# Patient Record
Sex: Female | Born: 1975
Health system: Southern US, Community
[De-identification: ages and names within clinical notes are randomized; demographics above are authoritative.]

## PROBLEM LIST (undated history)

## (undated) DIAGNOSIS — L400 Psoriasis vulgaris: Secondary | ICD-10-CM

## (undated) DIAGNOSIS — I10 Essential (primary) hypertension: Secondary | ICD-10-CM

## (undated) DIAGNOSIS — M4317 Spondylolisthesis, lumbosacral region: Secondary | ICD-10-CM

## (undated) DIAGNOSIS — K5904 Chronic idiopathic constipation: Secondary | ICD-10-CM

## (undated) DIAGNOSIS — E8941 Symptomatic postprocedural ovarian failure: Secondary | ICD-10-CM

## (undated) DIAGNOSIS — E785 Hyperlipidemia, unspecified: Secondary | ICD-10-CM

## (undated) DIAGNOSIS — G43909 Migraine, unspecified, not intractable, without status migrainosus: Secondary | ICD-10-CM

## (undated) DIAGNOSIS — J439 Emphysema, unspecified: Secondary | ICD-10-CM

## (undated) DIAGNOSIS — J3089 Other allergic rhinitis: Secondary | ICD-10-CM

## (undated) DIAGNOSIS — B019 Varicella without complication: Secondary | ICD-10-CM

## (undated) HISTORY — DX: Varicella without complication: B01.9

## (undated) HISTORY — DX: Symptomatic postprocedural ovarian failure: E89.41

## (undated) HISTORY — DX: Essential (primary) hypertension: I10

## (undated) HISTORY — DX: Other allergic rhinitis: J30.89

## (undated) HISTORY — DX: Spondylolisthesis, lumbosacral region: M43.17

## (undated) HISTORY — DX: Chronic idiopathic constipation: K59.04

## (undated) HISTORY — PX: PLANTAR'S WART EXCISION: SHX2240

## (undated) HISTORY — DX: Migraine, unspecified, not intractable, without status migrainosus: G43.909

## (undated) HISTORY — DX: Hyperlipidemia, unspecified: E78.5

## (undated) HISTORY — DX: Psoriasis vulgaris: L40.0

## (undated) HISTORY — PX: TOOTH EXTRACTION: SHX859

---

## 2002-10-11 HISTORY — PX: LAPAROSCOPY: SHX197

## 2004-02-08 HISTORY — PX: DILATION AND CURETTAGE OF UTERUS: SHX78

## 2006-05-03 HISTORY — PX: ABDOMINAL HYSTERECTOMY: SHX81

## 2012-02-08 HISTORY — PX: UMBILICAL HERNIA REPAIR: SHX196

## 2015-02-08 HISTORY — PX: ANORECTAL MANOMETRY: SHX298

## 2015-10-21 HISTORY — PX: COLONOSCOPY: SHX174

## 2016-01-13 DIAGNOSIS — I10 Essential (primary) hypertension: Secondary | ICD-10-CM | POA: Diagnosis not present

## 2016-01-13 DIAGNOSIS — Z6833 Body mass index (BMI) 33.0-33.9, adult: Secondary | ICD-10-CM | POA: Diagnosis not present

## 2016-01-14 DIAGNOSIS — M8588 Other specified disorders of bone density and structure, other site: Secondary | ICD-10-CM | POA: Diagnosis not present

## 2016-02-05 DIAGNOSIS — Z87891 Personal history of nicotine dependence: Secondary | ICD-10-CM | POA: Diagnosis not present

## 2016-02-05 DIAGNOSIS — J019 Acute sinusitis, unspecified: Secondary | ICD-10-CM | POA: Diagnosis not present

## 2016-02-05 DIAGNOSIS — B379 Candidiasis, unspecified: Secondary | ICD-10-CM | POA: Diagnosis not present

## 2016-06-10 DIAGNOSIS — R600 Localized edema: Secondary | ICD-10-CM | POA: Diagnosis not present

## 2016-06-10 DIAGNOSIS — M25532 Pain in left wrist: Secondary | ICD-10-CM | POA: Diagnosis not present

## 2016-06-10 DIAGNOSIS — J432 Centrilobular emphysema: Secondary | ICD-10-CM | POA: Diagnosis not present

## 2016-06-20 DIAGNOSIS — L4050 Arthropathic psoriasis, unspecified: Secondary | ICD-10-CM | POA: Diagnosis not present

## 2016-06-20 DIAGNOSIS — L409 Psoriasis, unspecified: Secondary | ICD-10-CM | POA: Diagnosis not present

## 2016-06-28 DIAGNOSIS — S92301D Fracture of unspecified metatarsal bone(s), right foot, subsequent encounter for fracture with routine healing: Secondary | ICD-10-CM | POA: Diagnosis not present

## 2016-06-28 DIAGNOSIS — M79671 Pain in right foot: Secondary | ICD-10-CM | POA: Diagnosis not present

## 2016-06-28 DIAGNOSIS — M84374A Stress fracture, right foot, initial encounter for fracture: Secondary | ICD-10-CM | POA: Diagnosis not present

## 2016-06-28 DIAGNOSIS — M7742 Metatarsalgia, left foot: Secondary | ICD-10-CM | POA: Diagnosis not present

## 2016-07-05 DIAGNOSIS — R600 Localized edema: Secondary | ICD-10-CM | POA: Diagnosis not present

## 2016-07-05 DIAGNOSIS — M25532 Pain in left wrist: Secondary | ICD-10-CM | POA: Diagnosis not present

## 2016-07-05 DIAGNOSIS — R0781 Pleurodynia: Secondary | ICD-10-CM | POA: Diagnosis not present

## 2016-07-05 DIAGNOSIS — J439 Emphysema, unspecified: Secondary | ICD-10-CM | POA: Diagnosis not present

## 2016-07-07 DIAGNOSIS — R600 Localized edema: Secondary | ICD-10-CM | POA: Diagnosis not present

## 2016-07-07 DIAGNOSIS — J439 Emphysema, unspecified: Secondary | ICD-10-CM | POA: Diagnosis not present

## 2016-07-07 DIAGNOSIS — F17210 Nicotine dependence, cigarettes, uncomplicated: Secondary | ICD-10-CM | POA: Diagnosis not present

## 2016-07-07 DIAGNOSIS — R0600 Dyspnea, unspecified: Secondary | ICD-10-CM | POA: Diagnosis not present

## 2016-08-04 DIAGNOSIS — J309 Allergic rhinitis, unspecified: Secondary | ICD-10-CM | POA: Diagnosis not present

## 2016-08-04 DIAGNOSIS — R05 Cough: Secondary | ICD-10-CM | POA: Diagnosis not present

## 2016-08-04 DIAGNOSIS — J439 Emphysema, unspecified: Secondary | ICD-10-CM | POA: Diagnosis not present

## 2016-08-04 DIAGNOSIS — R0781 Pleurodynia: Secondary | ICD-10-CM | POA: Diagnosis not present

## 2016-08-07 ENCOUNTER — Emergency Department: Payer: BLUE CROSS/BLUE SHIELD

## 2016-08-07 ENCOUNTER — Emergency Department
Admission: EM | Admit: 2016-08-07 | Discharge: 2016-08-07 | Disposition: A | Discharge status: Home / Self Care | Payer: BLUE CROSS/BLUE SHIELD | Attending: Emergency Medicine | Admitting: Emergency Medicine

## 2016-08-07 DIAGNOSIS — M25462 Effusion, left knee: Secondary | ICD-10-CM | POA: Diagnosis not present

## 2016-08-07 DIAGNOSIS — X58XXXA Exposure to other specified factors, initial encounter: Secondary | ICD-10-CM | POA: Diagnosis not present

## 2016-08-07 DIAGNOSIS — S93402A Sprain of unspecified ligament of left ankle, initial encounter: Secondary | ICD-10-CM

## 2016-08-07 DIAGNOSIS — Y9301 Activity, walking, marching and hiking: Secondary | ICD-10-CM | POA: Diagnosis not present

## 2016-08-07 DIAGNOSIS — S80212A Abrasion, left knee, initial encounter: Secondary | ICD-10-CM | POA: Diagnosis not present

## 2016-08-07 DIAGNOSIS — Y929 Unspecified place or not applicable: Secondary | ICD-10-CM | POA: Diagnosis not present

## 2016-08-07 DIAGNOSIS — Y999 Unspecified external cause status: Secondary | ICD-10-CM | POA: Diagnosis not present

## 2016-08-07 DIAGNOSIS — S8992XA Unspecified injury of left lower leg, initial encounter: Secondary | ICD-10-CM | POA: Diagnosis not present

## 2016-08-07 MED ORDER — BACLOFEN 10 MG PO TABS: 10.0000 mg | ORAL_TABLET | Freq: Three times a day (TID) | ORAL | 0 refills | Status: DC

## 2016-08-07 MED ORDER — BACITRACIN ZINC 500 UNIT/GM EX OINT: 1.0000 "application " | TOPICAL_OINTMENT | Freq: Two times a day (BID) | CUTANEOUS | Status: DC

## 2016-08-07 MED ORDER — TRAMADOL HCL 50 MG PO TABS: 50.0000 mg | ORAL_TABLET | Freq: Four times a day (QID) | ORAL | 0 refills | Status: DC | PRN

## 2016-08-07 MED ORDER — NAPROXEN 500 MG PO TABS: 500.0000 mg | ORAL_TABLET | Freq: Two times a day (BID) | ORAL | 0 refills | Status: DC

## 2016-08-07 NOTE — Discharge Instructions (Signed)
Please follow-up with your primary care provider or the orthopedist choice for symptoms that are not improving over the week. Return to the emergency department for symptoms that change or worsen after unable schedule an appointment.

## 2016-08-07 NOTE — ED Triage Notes (Signed)
Patient fell onto her left knee. C/o left knee pain

## 2016-08-07 NOTE — ED Provider Notes (Signed)
[facility] Emergency Department Provider Note ____________________________________________  Time seen: Approximately 2:53 PM  I have reviewed the triage vital signs and the nursing notes.   HISTORY  Chief Complaint Knee Pain    HPI Dawn Hays is a 41 y.o. female who presents to the emergency department for evaluation of left knee pain. While walking today, she states that she feels like her left knee just sort of "gave out." She fell directly onto the ground and landed on the knee. She now has pain just above the left knee, the knee itself, and the entire lower left leg including the ankle. Wound was cleaned after the fall. She has not taken any medications for her pain since the incident.  History reviewed. No pertinent past medical history.  There are no active problems to display for this patient.   No past surgical history on file.  Prior to Admission medications   Medication Sig Start Date End Date Taking? Authorizing Provider  baclofen (LIORESAL) 10 MG tablet Take 1 tablet (10 mg total) by mouth 3 (three) times daily. 08/07/16   Meaghann Choo B, FNP  naproxen (NAPROSYN) 500 MG tablet Take 1 tablet (500 mg total) by mouth 2 (two) times daily with a meal. 08/07/16   Monique Hefty B, FNP  traMADol (ULTRAM) 50 MG tablet Take 1 tablet (50 mg total) by mouth every 6 (six) hours as needed. 08/07/16   Chinita Pesterriplett, Chantalle Defilippo B, FNP    Allergies Patient has no allergy information on record.  History reviewed. No pertinent family history.  Social History Social History  Substance Use Topics  . Smoking status: Not on file  . Smokeless tobacco: Not on file  . Alcohol use Not on file    Review of Systems Constitutional: Negative for recent illness. Cardiovascular: Negative for chest pain. Respiratory: Negative for shortness of breath. Musculoskeletal: Positive for left lower extremity pain. Skin: Positive for abrasion to left knee.  Neurological: Negative for  decrease in sensation, paresthesias, ordered a cooperative.  ____________________________________________   PHYSICAL EXAM:  VITAL SIGNS: ED Triage Vitals  Enc Vitals Group     BP --      Pulse --      Resp --      Temp --      Temp src --      SpO2 --      Weight 08/07/16 1437 210 lb (95.3 kg)     Height 08/07/16 1437 5\' 5"  (1.651 m)     Head Circumference --      Peak Flow --      Pain Score 08/07/16 1434 8     Pain Loc --      Pain Edu? --      Excl. in GC? --     Constitutional: Alert and oriented. Well appearing and in no acute distress. Eyes: Conjunctivae are clear without discharge or drainage.  Head: Atraumatic. Neck: Nexus criteria is negative. Respiratory: Respirations are even and unlabored. Musculoskeletal: Patient unwilling to attempt to bend the knee, however she was observed changing positions in bed and was able to flex and extend the knee. Otherwise, there is full, active range of motion throughout. Neurologic: Sharp and dull discrimination is intact over the left lower extremity. Patient is awake and alert, oriented 4.  Skin: Abrasion noted to the left knee.  Psychiatric: Behavior and affect is appropriate.  ____________________________________________   LABS (all labs ordered are listed, but only abnormal results are displayed)  Labs Reviewed -  No data to display ____________________________________________  RADIOLOGY  Left knee him and shows small joint effusion, otherwise no bony abnormality. Left tibia and fibula image does not show any acute bony abnormality with exception of the leg and demonstrated joint effusion of the left knee. ____________________________________________   PROCEDURES  Procedure(s) performed: Knee immobilizer applied by ER tech. Patient neurovascularly intact post-application.  ____________________________________________   INITIAL IMPRESSION / ASSESSMENT AND PLAN / ED COURSE  Dawn Hays is a 41 y.o. female who  presents to the emergency department for evaluation and treatment of left lower extremity pain after a mechanical, non-syncopal fall. She has no acute bony abnormality demonstrated on imaging. She was applied and a knee immobilizer and given wound care instructions. She was instructed to follow-up with her primary care provider or orthopedics for symptoms that are not improving over the week. She was encouraged to return to the emergency department for symptoms that change or worsen if she is unable schedule an appointment.  Pertinent labs & imaging results that were available during my care of the patient were reviewed by me and considered in my medical decision making (see chart for details).  _________________________________________   FINAL CLINICAL IMPRESSION(S) / ED DIAGNOSES  Final diagnoses:  Effusion of left knee  Abrasion, knee, left, initial encounter  Sprain of left ankle, unspecified ligament, initial encounter    Discharge Medication List as of 08/07/2016  4:30 PM    START taking these medications   Details  baclofen (LIORESAL) 10 MG tablet Take 1 tablet (10 mg total) by mouth 3 (three) times daily., Starting Sun 08/07/2016, Print    naproxen (NAPROSYN) 500 MG tablet Take 1 tablet (500 mg total) by mouth 2 (two) times daily with a meal., Starting Sun 08/07/2016, Print    traMADol (ULTRAM) 50 MG tablet Take 1 tablet (50 mg total) by mouth every 6 (six) hours as needed., Starting Sun 08/07/2016, Print        If controlled substance prescribed during this visit, 12 month history viewed on the [facility] prior to issuing an initial prescription for Schedule II or III opiod.    Chinita Pester, FNP 08/07/16 1658    Sharyn Creamer, MD 08/09/16 [phone]

## 2016-08-11 ENCOUNTER — Emergency Department
Admission: EM | Admit: 2016-08-11 | Discharge: 2016-08-12 | Disposition: A | Discharge status: Home / Self Care | Payer: BLUE CROSS/BLUE SHIELD | Attending: Emergency Medicine | Admitting: Emergency Medicine

## 2016-08-11 ENCOUNTER — Emergency Department: Payer: BLUE CROSS/BLUE SHIELD

## 2016-08-11 DIAGNOSIS — T404X5A Adverse effect of other synthetic narcotics, initial encounter: Secondary | ICD-10-CM | POA: Diagnosis not present

## 2016-08-11 DIAGNOSIS — T50905A Adverse effect of unspecified drugs, medicaments and biological substances, initial encounter: Secondary | ICD-10-CM

## 2016-08-11 DIAGNOSIS — Y69 Unspecified misadventure during surgical and medical care: Secondary | ICD-10-CM | POA: Diagnosis not present

## 2016-08-11 DIAGNOSIS — T428X5A Adverse effect of antiparkinsonism drugs and other central muscle-tone depressants, initial encounter: Secondary | ICD-10-CM | POA: Diagnosis not present

## 2016-08-11 DIAGNOSIS — T887XXA Unspecified adverse effect of drug or medicament, initial encounter: Secondary | ICD-10-CM | POA: Diagnosis not present

## 2016-08-11 DIAGNOSIS — F17200 Nicotine dependence, unspecified, uncomplicated: Secondary | ICD-10-CM | POA: Diagnosis not present

## 2016-08-11 DIAGNOSIS — T39315A Adverse effect of propionic acid derivatives, initial encounter: Secondary | ICD-10-CM | POA: Diagnosis not present

## 2016-08-11 DIAGNOSIS — R4182 Altered mental status, unspecified: Secondary | ICD-10-CM | POA: Diagnosis not present

## 2016-08-11 HISTORY — DX: Emphysema, unspecified: J43.9

## 2016-08-11 LAB — COMPREHENSIVE METABOLIC PANEL
ALBUMIN: 3.5 g/dL (ref 3.5–5.0)
ALT: 14 U/L (ref 14–54)
AST: 24 U/L (ref 15–41)
Alkaline Phosphatase: 105 U/L (ref 38–126)
Anion gap: 10 (ref 5–15)
BUN: 16 mg/dL (ref 6–20)
CHLORIDE: 95 mmol/L — AB (ref 101–111)
CO2: 30 mmol/L (ref 22–32)
Calcium: 9.7 mg/dL (ref 8.9–10.3)
Creatinine, Ser: 1.01 mg/dL — ABNORMAL HIGH (ref 0.44–1.00)
GFR calc Af Amer: >60 mL/min (ref >60)
Glucose, Bld: 113 mg/dL — ABNORMAL HIGH (ref 65–99)
POTASSIUM: 3 mmol/L — AB (ref 3.5–5.1)
SODIUM: 135 mmol/L (ref 135–145)
Total Bilirubin: 0.5 mg/dL (ref 0.3–1.2)
Total Protein: 7.2 g/dL (ref 6.5–8.1)

## 2016-08-11 LAB — URINALYSIS, COMPLETE (UACMP) WITH MICROSCOPIC
Bilirubin Urine: NEGATIVE
Glucose, UA: NEGATIVE mg/dL
Hgb urine dipstick: NEGATIVE
KETONES UR: NEGATIVE mg/dL
LEUKOCYTES UA: NEGATIVE
Nitrite: NEGATIVE
PROTEIN: NEGATIVE mg/dL
Specific Gravity, Urine: 1.01 (ref 1.005–1.030)
WBC, UA: NONE SEEN WBC/hpf (ref 0–5)
pH: 5 (ref 5.0–8.0)

## 2016-08-11 LAB — URINE DRUG SCREEN, QUALITATIVE (ARMC ONLY)
Amphetamines, Ur Screen: NOT DETECTED
BENZODIAZEPINE, UR SCRN: NOT DETECTED
Barbiturates, Ur Screen: NOT DETECTED
CANNABINOID 50 NG, UR ~~LOC~~: NOT DETECTED
Cocaine Metabolite,Ur ~~LOC~~: NOT DETECTED
MDMA (Ecstasy)Ur Screen: NOT DETECTED
Methadone Scn, Ur: NOT DETECTED
Opiate, Ur Screen: NOT DETECTED
PHENCYCLIDINE (PCP) UR S: NOT DETECTED
Tricyclic, Ur Screen: NOT DETECTED

## 2016-08-11 LAB — POCT PREGNANCY, URINE: PREG TEST UR: NEGATIVE

## 2016-08-11 LAB — CBC
HEMATOCRIT: 31.9 % — AB (ref 35.0–47.0)
Hemoglobin: 11.2 g/dL — ABNORMAL LOW (ref 12.0–16.0)
MCH: 30.2 pg (ref 26.0–34.0)
MCHC: 35 g/dL (ref 32.0–36.0)
MCV: 86.4 fL (ref 80.0–100.0)
Platelets: 306 10*3/uL (ref 150–440)
RBC: 3.69 MIL/uL — AB (ref 3.80–5.20)
RDW: 13 % (ref 11.5–14.5)
WBC: 9.8 10*3/uL (ref 3.6–11.0)

## 2016-08-11 LAB — GLUCOSE, CAPILLARY
GLUCOSE-CAPILLARY: 117 mg/dL — AB (ref 65–99)
GLUCOSE-CAPILLARY: 119 mg/dL — AB (ref 65–99)

## 2016-08-11 MED ORDER — NALOXONE HCL 2 MG/2ML IJ SOSY
1.0000 mg | PREFILLED_SYRINGE | Freq: Once | INTRAMUSCULAR | Status: AC
  Administered 2016-08-11: 1 mg via INTRAVENOUS
  Filled 2016-08-11: qty 2

## 2016-08-11 NOTE — ED Notes (Signed)
Blood sugar 119

## 2016-08-11 NOTE — ED Notes (Signed)
Blood sugar 117

## 2016-08-11 NOTE — ED Provider Notes (Signed)
[facility] Emergency Department Provider Note       Time seen: ----------------------------------------- 8:21 PM on 08/11/2016 -----------------------------------------    I have reviewed the triage vital signs and the nursing notes.   HISTORY   Chief Complaint Altered Mental Status    HPI Dawn Hays is a 41 y.o. female who presents to the ED for complaints of lethargy and altered mental status. Patient was recently placed on baclofen, tramadol and naproxen for pain. According to the husband she has been taking the medications as prescribed. Patient also is having tremors in the room, patient states her arms and legs keep wanting to move on their own.   Past Medical History:  Diagnosis Date  . Psoriasis   . Pulmonary emphysema (HCC)     There are no active problems to display for this patient.   History reviewed. No pertinent surgical history.  Allergies Latex; Prednisone; Promethazine; Proxine hemorrhoidal [pramoxine]; and Sumatriptan  Social History Social History  Substance Use Topics  . Smoking status: Current Every Day Smoker  . Smokeless tobacco: Not on file  . Alcohol use No    Review of Systems Constitutional: Negative for fever. Eyes: Negative for vision changes ENT:  Negative for congestion, sore throat Cardiovascular: Negative for chest pain. Respiratory: Negative for shortness of breath. Gastrointestinal: Negative for abdominal pain, vomiting and diarrhea. Genitourinary: Negative for dysuria. Musculoskeletal: Positive left knee pain Skin: Positive for chronic skin disorder Neurological: Positive for altered mental status, tremors  All systems negative/normal/unremarkable except as stated in the HPI  ____________________________________________   PHYSICAL EXAM:  VITAL SIGNS: ED Triage Vitals  Enc Vitals Group     BP 08/11/16 1950 126/70     Pulse Rate 08/11/16 1950 79     Resp 08/11/16 1950 12     Temp  08/11/16 1950 98.5 F (36.9 C)     Temp Source 08/11/16 1950 Oral     SpO2 08/11/16 1950 96 %     Weight 08/11/16 1956 210 lb (95.3 kg)     Height 08/11/16 1956 5\' 5"  (1.651 m)     Head Circumference --      Peak Flow --      Pain Score --      Pain Loc --      Pain Edu? --      Excl. in GC? --    Constitutional: Alert and oriented. Well appearing and in no distress. Eyes: Conjunctivae are normal. Normal extraocular movements. ENT   Head: Normocephalic and atraumatic.   Nose: No congestion/rhinnorhea.   Mouth/Throat: Mucous membranes are moist.   Neck: No stridor. Cardiovascular: Normal rate, regular rhythm. No murmurs, rubs, or gallops. Respiratory: Normal respiratory effort without tachypnea nor retractions. Breath sounds are clear and equal bilaterally. No wheezes/rales/rhonchi. Gastrointestinal: Soft and nontender. Normal bowel sounds Musculoskeletal: Nontender with normal range of motion in extremities. No lower extremity tenderness nor edema. Neurologic:  Normal speech and language. No gross focal neurologic deficits are appreciated. Voluntary tremor is noted Skin:  Skin is warm, dry and intact. No rash noted. Psychiatric: Bizarre mood and affect at times ____________________________________________  ED COURSE:  Pertinent labs & imaging results that were available during my care of the patient were reviewed by me and considered in my medical decision making (see chart for details). Patient presents for altered mental status, we will assess with labs and imaging as indicated.   Procedures ____________________________________________   LABS (pertinent positives/negatives)  Labs Reviewed  COMPREHENSIVE METABOLIC PANEL -  Abnormal; Notable for the following:       Result Value   Potassium 3.0 (*)    Chloride 95 (*)    Glucose, Bld 113 (*)    Creatinine, Ser 1.01 (*)    All other components within normal limits  CBC - Abnormal; Notable for the following:     RBC 3.69 (*)    Hemoglobin 11.2 (*)    HCT 31.9 (*)    All other components within normal limits  URINALYSIS, COMPLETE (UACMP) WITH MICROSCOPIC - Abnormal; Notable for the following:    Color, Urine YELLOW (*)    APPearance CLEAR (*)    Bacteria, UA RARE (*)    Squamous Epithelial / LPF 0-5 (*)    All other components within normal limits  GLUCOSE, CAPILLARY - Abnormal; Notable for the following:    Glucose-Capillary 117 (*)    All other components within normal limits  GLUCOSE, CAPILLARY - Abnormal; Notable for the following:    Glucose-Capillary 119 (*)    All other components within normal limits  URINE DRUG SCREEN, QUALITATIVE ([facility] ONLY)  CBG MONITORING, ED  CBG MONITORING, ED  POCT PREGNANCY, URINE  POC URINE PREG, ED    RADIOLOGY Images were viewed by me  CT head IMPRESSION: Unremarkable noncontrast head CT. ____________________________________________  FINAL ASSESSMENT AND PLAN  Adverse medication reaction  Plan: Patient's labs and imaging were dictated above. Patient had presented for Altered mental status which appear to be secondary to medications that she is taking. I have instructed the husband to stop baclofen, tramadol and Soma. Otherwise her workup and testing here is unremarkable. She is stable for outpatient follow-up.   Emily FilbertWilliams, Jonathan E, MD   Note: This note was generated in part or whole with voice recognition software. Voice recognition is usually quite accurate but there are transcription errors that can and very often do occur. I apologize for any typographical errors that were not detected and corrected.     Emily FilbertWilliams, Jonathan E, MD 08/11/16 2252

## 2016-08-11 NOTE — ED Triage Notes (Signed)
Pt came in with husband with complaints of increased lethargy and AMS.  Pt was recently put on baclofen, tramadol and naproxen for pain on Sunday by this ER.  No medications missing and per husband he has been giving patient's her doses.  Pt unable to keep head up in triage.  When asked questions, pt repeats question back until she answers.  Pt has delayed responses and memory impairment.  Husband and daughter state that the last known well was Monday or Tuesday.  Pt having involuntary tremors as well at this time.  Report given to [place], RN and pt placed in room after CT.

## 2016-08-20 LAB — HM COLONOSCOPY

## 2016-10-17 DIAGNOSIS — E7800 Pure hypercholesterolemia, unspecified: Secondary | ICD-10-CM | POA: Diagnosis not present

## 2016-10-17 DIAGNOSIS — L409 Psoriasis, unspecified: Secondary | ICD-10-CM | POA: Diagnosis not present

## 2016-10-17 DIAGNOSIS — N951 Menopausal and female climacteric states: Secondary | ICD-10-CM | POA: Diagnosis not present

## 2016-10-17 DIAGNOSIS — R51 Headache: Secondary | ICD-10-CM | POA: Diagnosis not present

## 2016-10-17 DIAGNOSIS — Z79899 Other long term (current) drug therapy: Secondary | ICD-10-CM | POA: Diagnosis not present

## 2016-10-17 DIAGNOSIS — Z5181 Encounter for therapeutic drug level monitoring: Secondary | ICD-10-CM | POA: Diagnosis not present

## 2016-10-17 DIAGNOSIS — M4316 Spondylolisthesis, lumbar region: Secondary | ICD-10-CM | POA: Diagnosis not present

## 2016-10-17 DIAGNOSIS — F17210 Nicotine dependence, cigarettes, uncomplicated: Secondary | ICD-10-CM | POA: Diagnosis not present

## 2016-10-17 DIAGNOSIS — M533 Sacrococcygeal disorders, not elsewhere classified: Secondary | ICD-10-CM | POA: Diagnosis not present

## 2016-10-17 DIAGNOSIS — G894 Chronic pain syndrome: Secondary | ICD-10-CM | POA: Diagnosis not present

## 2016-10-20 DIAGNOSIS — D126 Benign neoplasm of colon, unspecified: Secondary | ICD-10-CM | POA: Diagnosis not present

## 2016-10-21 DIAGNOSIS — D120 Benign neoplasm of cecum: Secondary | ICD-10-CM | POA: Diagnosis not present

## 2016-10-21 DIAGNOSIS — D122 Benign neoplasm of ascending colon: Secondary | ICD-10-CM | POA: Diagnosis not present

## 2016-10-21 DIAGNOSIS — D124 Benign neoplasm of descending colon: Secondary | ICD-10-CM | POA: Diagnosis not present

## 2016-10-21 DIAGNOSIS — K635 Polyp of colon: Secondary | ICD-10-CM | POA: Diagnosis not present

## 2016-10-21 DIAGNOSIS — Z86010 Personal history of colonic polyps: Secondary | ICD-10-CM | POA: Diagnosis not present

## 2016-10-21 HISTORY — PX: COLONOSCOPY: SHX174

## 2016-12-19 DIAGNOSIS — L409 Psoriasis, unspecified: Secondary | ICD-10-CM | POA: Diagnosis not present

## 2016-12-19 DIAGNOSIS — Z5181 Encounter for therapeutic drug level monitoring: Secondary | ICD-10-CM | POA: Diagnosis not present

## 2016-12-23 DIAGNOSIS — I998 Other disorder of circulatory system: Secondary | ICD-10-CM | POA: Diagnosis not present

## 2016-12-23 DIAGNOSIS — R232 Flushing: Secondary | ICD-10-CM | POA: Diagnosis not present

## 2016-12-23 DIAGNOSIS — Z23 Encounter for immunization: Secondary | ICD-10-CM | POA: Diagnosis not present

## 2016-12-23 DIAGNOSIS — M4316 Spondylolisthesis, lumbar region: Secondary | ICD-10-CM | POA: Diagnosis not present

## 2016-12-23 DIAGNOSIS — Z720 Tobacco use: Secondary | ICD-10-CM | POA: Diagnosis not present

## 2017-01-24 ENCOUNTER — Ambulatory Visit: Payer: BLUE CROSS/BLUE SHIELD | Admitting: Primary Care

## 2017-02-03 DIAGNOSIS — R0781 Pleurodynia: Secondary | ICD-10-CM | POA: Diagnosis not present

## 2017-02-03 DIAGNOSIS — N83209 Unspecified ovarian cyst, unspecified side: Secondary | ICD-10-CM | POA: Diagnosis not present

## 2017-02-03 DIAGNOSIS — R232 Flushing: Secondary | ICD-10-CM | POA: Diagnosis not present

## 2017-02-03 DIAGNOSIS — R0789 Other chest pain: Secondary | ICD-10-CM | POA: Diagnosis not present

## 2017-02-03 DIAGNOSIS — J432 Centrilobular emphysema: Secondary | ICD-10-CM | POA: Diagnosis not present

## 2017-02-03 DIAGNOSIS — F17200 Nicotine dependence, unspecified, uncomplicated: Secondary | ICD-10-CM | POA: Diagnosis not present

## 2017-02-03 DIAGNOSIS — G894 Chronic pain syndrome: Secondary | ICD-10-CM | POA: Diagnosis not present

## 2017-02-03 DIAGNOSIS — R700 Elevated erythrocyte sedimentation rate: Secondary | ICD-10-CM | POA: Diagnosis not present

## 2017-02-03 DIAGNOSIS — M4316 Spondylolisthesis, lumbar region: Secondary | ICD-10-CM | POA: Diagnosis not present

## 2017-02-03 DIAGNOSIS — F17210 Nicotine dependence, cigarettes, uncomplicated: Secondary | ICD-10-CM | POA: Diagnosis not present

## 2017-02-13 ENCOUNTER — Ambulatory Visit (INDEPENDENT_AMBULATORY_CARE_PROVIDER_SITE_OTHER): Payer: BLUE CROSS/BLUE SHIELD | Admitting: Primary Care

## 2017-02-13 ENCOUNTER — Encounter: Payer: Self-pay | Admitting: Primary Care

## 2017-02-13 VITALS — BP 122/76 | HR 79 | Temp 97.9°F | Ht 63.25 in | Wt 166.8 lb

## 2017-02-13 DIAGNOSIS — J3089 Other allergic rhinitis: Secondary | ICD-10-CM | POA: Diagnosis not present

## 2017-02-13 DIAGNOSIS — E8941 Symptomatic postprocedural ovarian failure: Secondary | ICD-10-CM | POA: Diagnosis not present

## 2017-02-13 DIAGNOSIS — R600 Localized edema: Secondary | ICD-10-CM | POA: Diagnosis not present

## 2017-02-13 DIAGNOSIS — E785 Hyperlipidemia, unspecified: Secondary | ICD-10-CM | POA: Diagnosis not present

## 2017-02-13 DIAGNOSIS — M4317 Spondylolisthesis, lumbosacral region: Secondary | ICD-10-CM | POA: Diagnosis not present

## 2017-02-13 DIAGNOSIS — K5904 Chronic idiopathic constipation: Secondary | ICD-10-CM | POA: Diagnosis not present

## 2017-02-13 DIAGNOSIS — L400 Psoriasis vulgaris: Secondary | ICD-10-CM | POA: Diagnosis not present

## 2017-02-13 DIAGNOSIS — I10 Essential (primary) hypertension: Secondary | ICD-10-CM

## 2017-02-13 MED ORDER — HYDROCHLOROTHIAZIDE 12.5 MG PO CAPS: 12.5000 mg | ORAL_CAPSULE | Freq: Every day | ORAL | 0 refills | Status: DC

## 2017-02-13 MED ORDER — ATORVASTATIN CALCIUM 40 MG PO TABS: ORAL_TABLET | ORAL | 1 refills | Status: DC

## 2017-02-13 NOTE — Progress Notes (Signed)
Subjective:    Patient ID: Dawn Hays, female    DOB: [birth year], 42 y.o.   MRN: [id number]  HPI  Dawn Hays is a 42 year old female who presents today to establish care and discuss the problems mentioned below. Will obtain old records.  1) Hyperlipidemia: Previously managed on atorvastatin 40 mg once weekly for which she's been taking for the past one year. This was due to significant drops in her cholesterol levels, not due to myalgias. She does have a history of lower back pain. Her last lipid panel was normal within the last several months.   2)  Plaque Psoriasis: Currently managed on Clobetasol 0.05% ointment and solution, Cosentyx. Currently following with Dermatologist through [facility]. She may be seeing a rheumatologist soon.   3) Chronic Back Pain/Spondylolisthesis: Grade IV on L5-S1 with severe foraminal and central stenosis. Currently managed on Robaxin 3-4 times daily, Cymbalta daily, Celebrex twice daily, gabapentin three times daily. She was previously managed by pain management and spine-orthopedics. She was released from both pain management and orthopedics as she was doing well on her medications. Her previous PCP refilled these medications.   4) Essential Hypertension: Currently managed on chlorthalidone 25 mg. She stopped taking this medication several months ago due to episodes of hypotension. She's checking her BP at home and gets readings of 90's/60's and then 160's/100's with most readings running 120-150/60-100's. She does struggle with lower extremity swelling which has returned since coming off of chlorthalidone.  She was placed on Lasix for one week with significant reduction in edema. She is on her feet for 12 hours daily, most everyday. She wears compression socks and hose without improvement.   BP Readings from Last 3 Encounters:  02/13/17 122/76  08/11/16 (!) 147/74  08/07/16 104/62     5) Allergic Rhinitis: Currently managed on Zyrtec-D, Singulair, and  Ventolin HFA. No recent use of albuterol inhaler. She is following with pulmonology for a bulla that was noted on xray. She is due for another appointment next month.   6) Vasomotor Post Menopause Symptoms: Currently managed on estradiol 0.025 mg/24hour patch. Hysterectomy in 2008. She does continue to get hot flashes, does have headaches.   7) IBS/Constipation: Currently managed on Trulance 3 mg. Chronic constipation since hernia surgery. Following with GI through [facility].   Review of Systems  Constitutional: Negative for unexpected weight change.  Respiratory: Negative for shortness of breath.   Cardiovascular: Negative for chest pain.  Gastrointestinal:       Chronic constipation   Genitourinary:       Mild hot flashes on estradiol patch  Musculoskeletal:       Chronic back pain  Skin:       Psoriasis  Allergic/Immunologic: Positive for environmental allergies and food allergies.  Neurological:       Intermittent headaches  Hematological: Negative for adenopathy.       Past Medical History:  Diagnosis Date  . Chickenpox   . Hyperlipidemia   . Hypertension   . Migraines   . Psoriasis   . Pulmonary emphysema (HCC)      Social History   Socioeconomic History  . Marital status: Married    Spouse name: Not on file  . Number of children: Not on file  . Years of education: Not on file  . Highest education level: Not on file  Social Needs  . Financial resource strain: Not on file  . Food insecurity - worry: Not on file  . Food insecurity -  inability: Not on file  . Transportation needs - medical: Not on file  . Transportation needs - non-medical: Not on file  Occupational History  . Not on file  Tobacco Use  . Smoking status: Current Every Day Smoker  . Smokeless tobacco: Never Used  Substance and Sexual Activity  . Alcohol use: No  . Drug use: No  . Sexual activity: Not on file  Other Topics Concern  . Not on file  Social History Narrative  . Not on file      Past Surgical History:  Procedure Laterality Date  . ABDOMINAL HYSTERECTOMY  05/03/2006  . CESAREAN SECTION  12/13/1997  . CESAREAN SECTION  08/31/2004  . COLONOSCOPY  10/21/2015  . COLONOSCOPY  10/21/2016  . LAPAROSCOPY  10/11/2002  . TOOTH EXTRACTION  03/200    Family History  Problem Relation Age of Onset  . COPD Mother   . Arthritis Maternal Grandmother   . Hypertension Maternal Grandmother   . Kidney disease Maternal Grandmother     Allergies  Allergen Reactions  . Sumatriptan Shortness Of Breath and Swelling  . Benzocaine-Resorcinol Swelling  . Latex Swelling  . Molds & Smuts     Other reaction(s): Other (See Comments) Other reaction(s): Sneezing  . Other     Other reaction(s): Other (See Comments) Household dust, dust mites, mold   . Pramoxine Swelling  . Prednisone Swelling    Other reaction(s): Cough (ALLERGY/intolerance), Coughing, Respiratory Depression Other reaction(s): Respiratory Depression Allergic to pills, she can tolerate injections Allergic to pills, she can tolerate injections   . Promethazine     Other reaction(s): Cough (ALLERGY/intolerance), Coughing Other reaction(s): Coughing   . Sumatriptan Succinate     Other reaction(s): Cough (ALLERGY/intolerance), Respiratory Depression    Current Outpatient Medications on File Prior to Visit  Medication Sig Dispense Refill  . Albuterol Sulfate (VENTOLIN HFA IN) Inhale 2 puffs into the lungs every 4 (four) hours as needed.    . Calcium Citrate (CITRACAL PO) Take 2 capsules by mouth 2 (two) times daily.    Marland Kitchen CALCIUM PO Take 1,000 mg by mouth daily.    . celecoxib (CELEBREX) 200 MG capsule Take 200 mg by mouth 2 (two) times daily.    . Cetirizine-Pseudoephedrine (ZYRTEC-D ALLERGY & CONGESTION PO) Take 1 tablet by mouth daily as needed.    . chlorthalidone (HYGROTON) 25 MG tablet Take 25 mg by mouth daily.    . Cholecalciferol (VITAMIN D PO) Take 5,000 Units by mouth daily.    . clobetasol  (TEMOVATE) 0.05 % external solution Apply 1 application topically 2 (two) times daily.    . clobetasol ointment (TEMOVATE) 0.05 % Apply 1 application topically 2 (two) times daily.    . DULoxetine (CYMBALTA) 60 MG capsule Take 60 mg by mouth daily.    Marland Kitchen estradiol (CLIMARA - DOSED IN MG/24 HR) 0.025 mg/24hr patch Place 0.025 mg onto the skin once a week.    . gabapentin (NEURONTIN) 300 MG capsule Taking 900 mg in the morning, 900 mg in the afternoon, and 900 mg at bedtime    . methocarbamol (ROBAXIN) 500 MG tablet Take 500 mg by mouth 4 (four) times daily.    . montelukast (SINGULAIR) 10 MG tablet Take 10 mg by mouth daily as needed.    Marland Kitchen Plecanatide (TRULANCE PO) Take 3 mg by mouth daily.    . Secukinumab (COSENTYX SENSOREADY PEN) 150 MG/ML SOAJ Inject 300 mg into skin every 4 weeks    . EPINEPHrine 0.3 mg/0.3  mL IJ SOAJ injection Inject 0.3 mg into the muscle once.    . fluconazole (DIFLUCAN) 150 MG tablet Take 1 tablet by mouth for 2 days then stop     No current facility-administered medications on file prior to visit.     BP 122/76   Pulse 79   Temp 97.9 F (36.6 C) (Oral)   Ht 5' 3.25" (1.607 m)   Wt 166 lb 12.8 oz (75.7 kg)   SpO2 97%   BMI 29.31 kg/m    Objective:   Physical Exam  Constitutional: She is oriented to person, place, and time. She appears well-nourished.  Neck: Neck supple.  Cardiovascular: Normal rate and regular rhythm.  Pulmonary/Chest: Effort normal and breath sounds normal.  Musculoskeletal:  Ambulates well in clinic, no gross decrease in ROM to lower spine  Neurological: She is alert and oriented to person, place, and time.  Skin: Skin is warm and dry.  Psychiatric: She has a normal mood and affect.          Assessment & Plan:

## 2017-02-13 NOTE — Assessment & Plan Note (Signed)
Following with pulmology (Dr. Larence PenningBullard[facility]. Exam unremarkable today.

## 2017-02-13 NOTE — Assessment & Plan Note (Signed)
Taking atorvastatin 40 mg once weekly? Will obtain records for reasoning behind this. Consider lowering dose. Will obtain lipid panel from records, repeat at up coming CPE.

## 2017-02-13 NOTE — Assessment & Plan Note (Signed)
Previously following with orthopedics and pain management. Will continue medications of Celebrex, gabapentin, Robaxin, and Cymbalta given stability. Will obtain records for imaging and notes.

## 2017-02-13 NOTE — Assessment & Plan Note (Addendum)
[facility] (Dr. Lanell MatarMishra), continue Trulance.

## 2017-02-13 NOTE — Assessment & Plan Note (Signed)
Discussed risk of long term use of hormones given hysterectomy, she verbalized understanding. She is on a low does and is changing her patch every 7 days. Advised she stop smoking given increased risks to her health.

## 2017-02-13 NOTE — Assessment & Plan Note (Addendum)
Stable in the office today off of medication, labile readings at home. Will trial a low dose HCTZ at 12.5 mg once daily. She will monitor blood pressure and update with any low readings. Will call for readings in 2 weeks, will obtain records for labs. Will readdress at upcoming CPE.

## 2017-02-13 NOTE — Patient Instructions (Signed)
I sent refills for your atorvastatin to the pharmacy.  Start hydrochlorothiazide 12.5 mg capsules once daily for high blood pressure and lower leg swelling.   Please schedule a physical with me in 2019. You may also schedule a lab only appointment 3-4 days prior. We will discuss your lab results in detail during your physical.  It was a pleasure to meet you today! Please don't hesitate to call or message me with any questions. Welcome to [facility]!

## 2017-02-13 NOTE — Assessment & Plan Note (Signed)
[facility] (Dr. Gibson RampFeldman). Continue current regimen.

## 2017-02-13 NOTE — Assessment & Plan Note (Signed)
Suspect due to long hours standing. Will trial low dose HCTZ 12.5 mg for edema. Continue compression socks, elevation. Will readdress at upcoming CPE.

## 2017-02-20 DIAGNOSIS — L409 Psoriasis, unspecified: Secondary | ICD-10-CM | POA: Diagnosis not present

## 2017-02-20 DIAGNOSIS — M79641 Pain in right hand: Secondary | ICD-10-CM | POA: Diagnosis not present

## 2017-02-20 DIAGNOSIS — M779 Enthesopathy, unspecified: Secondary | ICD-10-CM | POA: Diagnosis not present

## 2017-02-20 DIAGNOSIS — J439 Emphysema, unspecified: Secondary | ICD-10-CM | POA: Diagnosis not present

## 2017-02-20 DIAGNOSIS — M75102 Unspecified rotator cuff tear or rupture of left shoulder, not specified as traumatic: Secondary | ICD-10-CM | POA: Diagnosis not present

## 2017-02-20 DIAGNOSIS — M2550 Pain in unspecified joint: Secondary | ICD-10-CM | POA: Diagnosis not present

## 2017-02-20 DIAGNOSIS — M25511 Pain in right shoulder: Secondary | ICD-10-CM | POA: Diagnosis not present

## 2017-02-20 DIAGNOSIS — M79642 Pain in left hand: Secondary | ICD-10-CM | POA: Diagnosis not present

## 2017-02-20 DIAGNOSIS — M25512 Pain in left shoulder: Secondary | ICD-10-CM | POA: Diagnosis not present

## 2017-02-20 DIAGNOSIS — R0781 Pleurodynia: Secondary | ICD-10-CM | POA: Diagnosis not present

## 2017-02-20 DIAGNOSIS — M5440 Lumbago with sciatica, unspecified side: Secondary | ICD-10-CM | POA: Diagnosis not present

## 2017-02-20 DIAGNOSIS — M4316 Spondylolisthesis, lumbar region: Secondary | ICD-10-CM | POA: Diagnosis not present

## 2017-02-27 ENCOUNTER — Telehealth: Payer: Self-pay | Admitting: Primary Care

## 2017-02-27 NOTE — Telephone Encounter (Signed)
How are her BP readings and leg swelling since we started her on HCTZ 12.5 mg?

## 2017-03-01 NOTE — Telephone Encounter (Signed)
Please get patient scheduled for an office visit follow-up for blood pressure check and repeat BMP since switching medications from chlorthalidone to HCTZ.

## 2017-03-01 NOTE — Telephone Encounter (Signed)
In patient's exact words, it is alright and still have some leg swelling. She did not give specific number and I did asked.

## 2017-03-01 NOTE — Telephone Encounter (Signed)
Left message on voicemail for patient to call back. 

## 2017-03-06 NOTE — Telephone Encounter (Signed)
Spoken and notified patient of Kate's comments. Patient will back to schedule the appointment.  Will send reminder letter.

## 2017-03-08 ENCOUNTER — Encounter: Payer: Self-pay | Admitting: *Deleted

## 2017-04-22 ENCOUNTER — Other Ambulatory Visit: Payer: Self-pay | Admitting: Primary Care

## 2017-04-22 DIAGNOSIS — R600 Localized edema: Secondary | ICD-10-CM

## 2017-04-22 DIAGNOSIS — I10 Essential (primary) hypertension: Secondary | ICD-10-CM

## 2017-04-24 DIAGNOSIS — R232 Flushing: Secondary | ICD-10-CM | POA: Diagnosis not present

## 2017-04-24 DIAGNOSIS — I998 Other disorder of circulatory system: Secondary | ICD-10-CM | POA: Diagnosis not present

## 2017-04-24 DIAGNOSIS — Z5181 Encounter for therapeutic drug level monitoring: Secondary | ICD-10-CM | POA: Diagnosis not present

## 2017-04-24 DIAGNOSIS — L409 Psoriasis, unspecified: Secondary | ICD-10-CM | POA: Diagnosis not present

## 2017-04-24 DIAGNOSIS — Z79899 Other long term (current) drug therapy: Secondary | ICD-10-CM | POA: Diagnosis not present

## 2017-05-13 ENCOUNTER — Other Ambulatory Visit: Payer: Self-pay | Admitting: Primary Care

## 2017-05-13 DIAGNOSIS — R600 Localized edema: Secondary | ICD-10-CM

## 2017-05-13 DIAGNOSIS — I10 Essential (primary) hypertension: Secondary | ICD-10-CM

## 2017-09-04 ENCOUNTER — Telehealth: Payer: Self-pay | Admitting: Primary Care

## 2017-09-04 DIAGNOSIS — J3089 Other allergic rhinitis: Secondary | ICD-10-CM

## 2017-09-04 NOTE — Telephone Encounter (Signed)
Copied from CRM [phone]. Topic: Quick Communication - See Telephone Encounter >> Sep 04, 2017 10:50 AM Waymon AmatoBurton, Donna F wrote: Pt is needing a refill on singular -pt completely out and pt states that the pharmacy requested this originally last week   Best number [phone]  [facility] [address] [place]

## 2017-09-04 NOTE — Telephone Encounter (Signed)
Singulair refill Last Refill:02/13/17 by historical provider Last OV: 02/13/17 PCP: Eldridge DaceK. Clark,Np Pharmacy: [facility] on [address], [place]

## 2017-09-05 MED ORDER — MONTELUKAST SODIUM 10 MG PO TABS: ORAL_TABLET | ORAL | 0 refills | Status: DC

## 2017-09-05 NOTE — Telephone Encounter (Signed)
Pt established care 02/13/17 and pt was seeing pulmonologist.Please advise.

## 2017-09-05 NOTE — Telephone Encounter (Signed)
Spoken and notified patient of Kate Clark's comments. Patient verbalized understanding.  

## 2017-09-05 NOTE — Telephone Encounter (Signed)
Please kindly notify patient that we never received a refill request from her pharmacy. Sometimes the pharmacy will send the request to the prior prescriber which is likely what happened in this case. I'll send a refill now.

## 2017-10-04 ENCOUNTER — Other Ambulatory Visit: Payer: Self-pay | Admitting: Primary Care

## 2017-10-04 DIAGNOSIS — E785 Hyperlipidemia, unspecified: Secondary | ICD-10-CM

## 2017-10-04 NOTE — Telephone Encounter (Signed)
Noted, will discuss at upcoming visit.

## 2017-10-04 NOTE — Telephone Encounter (Signed)
Last prescribed on 02/13/2017 Last office visit on 02/13/2017. Next appt on 11/06/2017

## 2017-11-04 ENCOUNTER — Other Ambulatory Visit: Payer: Self-pay | Admitting: Primary Care

## 2017-11-04 DIAGNOSIS — E785 Hyperlipidemia, unspecified: Secondary | ICD-10-CM

## 2017-11-06 ENCOUNTER — Encounter: Payer: BLUE CROSS/BLUE SHIELD | Admitting: Primary Care

## 2017-11-06 DIAGNOSIS — Z0289 Encounter for other administrative examinations: Secondary | ICD-10-CM

## 2017-11-08 ENCOUNTER — Encounter (INDEPENDENT_AMBULATORY_CARE_PROVIDER_SITE_OTHER): Payer: Self-pay

## 2017-11-08 ENCOUNTER — Ambulatory Visit (INDEPENDENT_AMBULATORY_CARE_PROVIDER_SITE_OTHER): Payer: BLUE CROSS/BLUE SHIELD | Admitting: Primary Care

## 2017-11-08 ENCOUNTER — Encounter: Payer: Self-pay | Admitting: Primary Care

## 2017-11-08 VITALS — BP 118/72 | HR 69 | Temp 97.8°F | Ht 61.0 in | Wt 147.5 lb

## 2017-11-08 DIAGNOSIS — Z23 Encounter for immunization: Secondary | ICD-10-CM

## 2017-11-08 DIAGNOSIS — E785 Hyperlipidemia, unspecified: Secondary | ICD-10-CM | POA: Diagnosis not present

## 2017-11-08 DIAGNOSIS — Z0000 Encounter for general adult medical examination without abnormal findings: Secondary | ICD-10-CM | POA: Diagnosis not present

## 2017-11-08 DIAGNOSIS — Z0184 Encounter for antibody response examination: Secondary | ICD-10-CM

## 2017-11-08 DIAGNOSIS — L400 Psoriasis vulgaris: Secondary | ICD-10-CM

## 2017-11-08 DIAGNOSIS — Z1239 Encounter for other screening for malignant neoplasm of breast: Secondary | ICD-10-CM

## 2017-11-08 DIAGNOSIS — K5904 Chronic idiopathic constipation: Secondary | ICD-10-CM

## 2017-11-08 DIAGNOSIS — R600 Localized edema: Secondary | ICD-10-CM | POA: Diagnosis not present

## 2017-11-08 DIAGNOSIS — T65814S Toxic effect of latex, undetermined, sequela: Secondary | ICD-10-CM

## 2017-11-08 DIAGNOSIS — T65811A Toxic effect of latex, accidental (unintentional), initial encounter: Secondary | ICD-10-CM | POA: Insufficient documentation

## 2017-11-08 DIAGNOSIS — I10 Essential (primary) hypertension: Secondary | ICD-10-CM

## 2017-11-08 DIAGNOSIS — M4317 Spondylolisthesis, lumbosacral region: Secondary | ICD-10-CM

## 2017-11-08 MED ORDER — EPINEPHRINE 0.3 MG/0.3ML IJ SOAJ: 0.3000 mg | Freq: Once | INTRAMUSCULAR | 0 refills | Status: AC

## 2017-11-08 MED ORDER — FUROSEMIDE 20 MG PO TABS: ORAL_TABLET | ORAL | 0 refills | Status: DC

## 2017-11-08 NOTE — Assessment & Plan Note (Signed)
No recent event. Refilled epi pen.

## 2017-11-08 NOTE — Assessment & Plan Note (Signed)
Repeat lipids pending. Continue atorvastatin once weekly.

## 2017-11-08 NOTE — Assessment & Plan Note (Addendum)
Chronic. Wears compression stockings with temporary improvement until she removes them in the evening.  HCTZ caused "increased BP". Was once on Lasix in the past with improvement, she used this daily for 5 days then PRN. Undergone evaluation with vascular specialists in the past and was told they were unsure of the reason for her swelling.   Will trial low dose PRN furosemide. Discussed to monitor BP and that she may need to start with 10 mg. She verbalized understanding. She will update.

## 2017-11-08 NOTE — Assessment & Plan Note (Signed)
Overall stable. Robaxin 500 mg was on back order so she's been switched to Robaxin 750 mg TID. Continue Cymbalta, Robaxin, gabapentin.

## 2017-11-08 NOTE — Assessment & Plan Note (Signed)
Stable in the office today. Continue to monitor. Commended her on weight loss.

## 2017-11-08 NOTE — Addendum Note (Signed)
Addended by: Tawnya Crook on: 11/08/2017 04:19 PM   Modules accepted: Orders

## 2017-11-08 NOTE — Progress Notes (Signed)
Subjective:    Patient ID: Dawn Hays, female    DOB: [birth year], 42 y.o.   MRN: [id number]  HPI  Dawn Hays is a 42 year old female who presents today for complete physical.  Immunizations: -Tetanus: Unsure.  -Influenza: Due -Pneumonia: Completed in 2018  Diet: She endorses a healthy diet. Breakfast: Breakfast sandwich, cereal  Lunch: Salad, left overs Dinner: Meat, vegetable, pasta, potatoes  Snacks: Crackers, nuts Desserts: Infrequent  Beverages: Water, occasional orange juice, soda  Exercise: She is working out at [employer] 3 days weekly  Eye exam: Completed in 2018 Dental exam: No recent exam  Pap Smear: Hysterectomy  Mammogram: Never completed   Wt Readings from Last 3 Encounters:  11/08/17 147 lb 8 oz (66.9 kg)  02/13/17 166 lb 12.8 oz (75.7 kg)  08/11/16 210 lb (95.3 kg)   BP Readings from Last 3 Encounters:  11/08/17 118/72  02/13/17 122/76  08/11/16 (!) 147/74      Review of Systems  Constitutional: Negative for unexpected weight change.  HENT: Negative for rhinorrhea.   Respiratory: Negative for cough and shortness of breath.   Cardiovascular: Positive for leg swelling. Negative for chest pain.  Gastrointestinal: Negative for diarrhea.       Intermittent constipation   Genitourinary: Negative for difficulty urinating.  Musculoskeletal: Negative for arthralgias and myalgias.  Skin: Negative for rash.  Allergic/Immunologic: Negative for environmental allergies.  Neurological: Negative for dizziness, numbness and headaches.       Past Medical History:  Diagnosis Date  . Chickenpox   . Chronic idiopathic constipation   . Environmental and seasonal allergies   . Hot flashes due to surgical menopause   . Hyperlipidemia   . Hypertension   . Migraines   . Plaque psoriasis   . Pulmonary emphysema (HCC)   . Spondylolisthesis at L5-S1 level    Grade IV     Social History   Socioeconomic History  . Marital status: Married    Spouse name: Not  on file  . Number of children: Not on file  . Years of education: Not on file  . Highest education level: Not on file  Occupational History  . Not on file  Social Needs  . Financial resource strain: Not on file  . Food insecurity:    Worry: Not on file    Inability: Not on file  . Transportation needs:    Medical: Not on file    Non-medical: Not on file  Tobacco Use  . Smoking status: Current Every Day Smoker  . Smokeless tobacco: Never Used  Substance and Sexual Activity  . Alcohol use: No  . Drug use: No  . Sexual activity: Not on file  Lifestyle  . Physical activity:    Days per week: Not on file    Minutes per session: Not on file  . Stress: Not on file  Relationships  . Social connections:    Talks on phone: Not on file    Gets together: Not on file    Attends religious service: Not on file    Active member of club or organization: Not on file    Attends meetings of clubs or organizations: Not on file    Relationship status: Not on file  . Intimate partner violence:    Fear of current or ex partner: Not on file    Emotionally abused: Not on file    Physically abused: Not on file    Forced sexual activity: Not on file  Other Topics Concern  . Not on file  Social History Narrative  . Not on file    Past Surgical History:  Procedure Laterality Date  . ABDOMINAL HYSTERECTOMY  05/03/2006   Partial, ovaries remain  . ANORECTAL MANOMETRY  2017  . CESAREAN SECTION  12/13/1997  . CESAREAN SECTION  08/31/2004  . COLONOSCOPY  10/21/2015  . COLONOSCOPY  10/21/2016  . DILATION AND CURETTAGE OF UTERUS  2006  . LAPAROSCOPY  10/11/2002   endometriosis  . PLANTAR'S WART EXCISION    . TOOTH EXTRACTION  03/200  . UMBILICAL HERNIA REPAIR  2014    Family History  Problem Relation Age of Onset  . COPD Mother   . Arthritis Maternal Grandmother   . Hypertension Maternal Grandmother   . Kidney disease Maternal Grandmother     Allergies  Allergen Reactions  .  Sumatriptan Shortness Of Breath and Swelling  . Benzocaine-Resorcinol Swelling  . Latex Swelling  . Molds & Smuts     Other reaction(s): Other (See Comments) Other reaction(s): Sneezing  . Other     Other reaction(s): Other (See Comments) Household dust, dust mites, mold   . Pramoxine Swelling  . Prednisone Swelling    Other reaction(s): Cough (ALLERGY/intolerance), Coughing, Respiratory Depression Other reaction(s): Respiratory Depression Allergic to pills, she can tolerate injections Allergic to pills, she can tolerate injections   . Promethazine     Other reaction(s): Cough (ALLERGY/intolerance), Coughing Other reaction(s): Coughing   . Sumatriptan Succinate     Other reaction(s): Cough (ALLERGY/intolerance), Respiratory Depression    Current Outpatient Medications on File Prior to Visit  Medication Sig Dispense Refill  . atorvastatin (LIPITOR) 40 MG tablet TAKE 1 TABLET BY MOUTH EVERY WEEK 4 tablet 0  . Calcium Citrate (CITRACAL PO) Take 2 capsules by mouth 2 (two) times daily.    Marland Kitchen CALCIUM PO Take 1,000 mg by mouth daily.    . celecoxib (CELEBREX) 200 MG capsule Take 200 mg by mouth 2 (two) times daily.    . Cetirizine-Pseudoephedrine (ZYRTEC-D ALLERGY & CONGESTION PO) Take 1 tablet by mouth daily as needed.    . Cholecalciferol (VITAMIN D PO) Take 5,000 Units by mouth daily.    . clobetasol (TEMOVATE) 0.05 % external solution Apply 1 application topically 2 (two) times daily.    . clobetasol ointment (TEMOVATE) 0.05 % Apply 1 application topically 2 (two) times daily.    . DULoxetine (CYMBALTA) 60 MG capsule Take 60 mg by mouth daily.    Marland Kitchen estradiol (CLIMARA - DOSED IN MG/24 HR) 0.025 mg/24hr patch Place 0.025 mg onto the skin once a week.    . fluconazole (DIFLUCAN) 150 MG tablet Take 1 tablet by mouth for 2 days then stop    . gabapentin (NEURONTIN) 300 MG capsule Taking 900 mg in the morning, 900 mg in the afternoon, and 900 mg at bedtime    . methocarbamol (ROBAXIN)  750 MG tablet Take 750 mg by mouth 3 (three) times daily as needed.    . montelukast (SINGULAIR) 10 MG tablet Take 1 tablet by mouth at bedtime for allergies. 90 tablet 0  . Plecanatide (TRULANCE PO) Take 3 mg by mouth daily.    . Secukinumab (COSENTYX SENSOREADY PEN) 150 MG/ML SOAJ Inject 300 mg into skin every 4 weeks     No current facility-administered medications on file prior to visit.     BP 118/72   Pulse 69   Temp 97.8 F (36.6 C) (Oral)   Ht 5\' 1"  (1.549  m)   Wt 147 lb 8 oz (66.9 kg)   SpO2 98%   BMI 27.87 kg/m    Objective:   Physical Exam  Constitutional: She is oriented to person, place, and time. She appears well-nourished.  HENT:  Mouth/Throat: No oropharyngeal exudate.  Eyes: Pupils are equal, round, and reactive to light. EOM are normal.  Neck: Neck supple. No thyromegaly present.  Cardiovascular: Normal rate and regular rhythm.  Compression stockings in place, mild edema noted to right ankle.   Respiratory: Effort normal and breath sounds normal.  GI: Soft. Bowel sounds are normal. There is no tenderness.  Musculoskeletal: Normal range of motion.  Neurological: She is alert and oriented to person, place, and time.  Skin: Skin is warm and dry.  Psychiatric: She has a normal mood and affect.           Assessment & Plan:

## 2017-11-08 NOTE — Patient Instructions (Addendum)
Stop by the lab prior to leaving today. I will notify you of your results once received.   Call the [facility] to schedule your mammogram.   Continue exercising. You should be getting 150 minutes of moderate intensity exercise weekly.  Continue to work on a healthy diet.   Take the furosemide (Lasix) daily for five days for swelling then stop and use as needed.   We will see you in one year for your annual exam or sooner if needed.  It was a pleasure to see you today!   Preventive Care 40-64 Years, Female Preventive care refers to lifestyle choices and visits with your health care provider that can promote health and wellness. What does preventive care include?  A yearly physical exam. This is also called an annual well check.  Dental exams once or twice a year.  Routine eye exams. Ask your health care provider how often you should have your eyes checked.  Personal lifestyle choices, including: ? Daily care of your teeth and gums. ? Regular physical activity. ? Eating a healthy diet. ? Avoiding tobacco and drug use. ? Limiting alcohol use. ? Practicing safe sex. ? Taking low-dose aspirin daily starting at age 48. ? Taking vitamin and mineral supplements as recommended by your health care provider. What happens during an annual well check? The services and screenings done by your health care provider during your annual well check will depend on your age, overall health, lifestyle risk factors, and family history of disease. Counseling Your health care provider may ask you questions about your:  Alcohol use.  Tobacco use.  Drug use.  Emotional well-being.  Home and relationship well-being.  Sexual activity.  Eating habits.  Work and work Statistician.  Method of birth control.  Menstrual cycle.  Pregnancy history.  Screening You may have the following tests or measurements:  Height, weight, and BMI.  Blood pressure.  Lipid and cholesterol  levels. These may be checked every 5 years, or more frequently if you are over 91 years old.  Skin check.  Lung cancer screening. You may have this screening every year starting at age 73 if you have a 30-pack-year history of smoking and currently smoke or have quit within the past 15 years.  Fecal occult blood test (FOBT) of the stool. You may have this test every year starting at age 29.  Flexible sigmoidoscopy or colonoscopy. You may have a sigmoidoscopy every 5 years or a colonoscopy every 10 years starting at age 70.  Hepatitis C blood test.  Hepatitis B blood test.  Sexually transmitted disease (STD) testing.  Diabetes screening. This is done by checking your blood sugar (glucose) after you have not eaten for a while (fasting). You may have this done every 1-3 years.  Mammogram. This may be done every 1-2 years. Talk to your health care provider about when you should start having regular mammograms. This may depend on whether you have a family history of breast cancer.  BRCA-related cancer screening. This may be done if you have a family history of breast, ovarian, tubal, or peritoneal cancers.  Pelvic exam and Pap test. This may be done every 3 years starting at age 73. Starting at age 1, this may be done every 5 years if you have a Pap test in combination with an HPV test.  Bone density scan. This is done to screen for osteoporosis. You may have this scan if you are at high risk for osteoporosis.  Discuss your test  results, treatment options, and if necessary, the need for more tests with your health care provider. Vaccines Your health care provider may recommend certain vaccines, such as:  Influenza vaccine. This is recommended every year.  Tetanus, diphtheria, and acellular pertussis (Tdap, Td) vaccine. You may need a Td booster every 10 years.  Varicella vaccine. You may need this if you have not been vaccinated.  Zoster vaccine. You may need this after age  54.  Measles, mumps, and rubella (MMR) vaccine. You may need at least one dose of MMR if you were born in 1957 or later. You may also need a second dose.  Pneumococcal 13-valent conjugate (PCV13) vaccine. You may need this if you have certain conditions and were not previously vaccinated.  Pneumococcal polysaccharide (PPSV23) vaccine. You may need one or two doses if you smoke cigarettes or if you have certain conditions.  Meningococcal vaccine. You may need this if you have certain conditions.  Hepatitis A vaccine. You may need this if you have certain conditions or if you travel or work in places where you may be exposed to hepatitis A.  Hepatitis B vaccine. You may need this if you have certain conditions or if you travel or work in places where you may be exposed to hepatitis B.  Haemophilus influenzae type b (Hib) vaccine. You may need this if you have certain conditions.  Talk to your health care provider about which screenings and vaccines you need and how often you need them. This information is not intended to replace advice given to you by your health care provider. Make sure you discuss any questions you have with your health care provider. Document Released: 02/20/2015 Document Revised: 10/14/2015 Document Reviewed: 11/25/2014 Elsevier Interactive Patient Education  [employer].

## 2017-11-08 NOTE — Assessment & Plan Note (Signed)
Following with dermatology through [facility].

## 2017-11-08 NOTE — Assessment & Plan Note (Signed)
Following with GI through [facility].  Continue current regimen.

## 2017-11-08 NOTE — Assessment & Plan Note (Signed)
Td due, provided today.  Mammogram due, orders placed. Recommended to continue to work on diet, continue with regular exercise.  Exam unremarkable. Labs pending. Follow up in 1 year for CPE.

## 2017-11-09 LAB — COMPREHENSIVE METABOLIC PANEL
ALT: 8 U/L (ref 0–35)
AST: 13 U/L (ref 0–37)
Albumin: 3.9 g/dL (ref 3.5–5.2)
Alkaline Phosphatase: 68 U/L (ref 39–117)
BUN: 10 mg/dL (ref 6–23)
CHLORIDE: 102 meq/L (ref 96–112)
CO2: 31 meq/L (ref 19–32)
Calcium: 9.2 mg/dL (ref 8.4–10.5)
Creatinine, Ser: 0.92 mg/dL (ref 0.40–1.20)
GFR: 71.5 mL/min (ref >60.00)
Glucose, Bld: 77 mg/dL (ref 70–99)
POTASSIUM: 3.4 meq/L — AB (ref 3.5–5.1)
Sodium: 136 mEq/L (ref 135–145)
Total Bilirubin: 0.3 mg/dL (ref 0.2–1.2)
Total Protein: 6.8 g/dL (ref 6.0–8.3)

## 2017-11-09 LAB — VARICELLA ZOSTER ANTIBODY, IGG: Varicella IgG: 835.1 index

## 2017-11-09 LAB — LIPID PANEL
Cholesterol: 166 mg/dL (ref 0–200)
HDL: 27.4 mg/dL — AB (ref >39.00)
NONHDL: 138.34
TRIGLYCERIDES: 281 mg/dL — AB (ref 0.0–149.0)
Total CHOL/HDL Ratio: 6
VLDL: 56.2 mg/dL — AB (ref 0.0–40.0)

## 2017-11-09 LAB — MEASLES/MUMPS/RUBELLA IMMUNITY
Mumps IgG: 12.6 AU/mL
RUBEOLA IGG: 48.3 [AU]/ml
Rubella: 14.7 index

## 2017-11-09 LAB — LDL CHOLESTEROL, DIRECT: Direct LDL: 96 mg/dL

## 2017-11-10 ENCOUNTER — Encounter: Payer: Self-pay | Admitting: *Deleted

## 2017-11-29 ENCOUNTER — Other Ambulatory Visit: Payer: Self-pay | Admitting: Primary Care

## 2017-11-29 DIAGNOSIS — J3089 Other allergic rhinitis: Secondary | ICD-10-CM

## 2017-12-07 ENCOUNTER — Other Ambulatory Visit: Payer: Self-pay | Admitting: Primary Care

## 2017-12-07 DIAGNOSIS — R600 Localized edema: Secondary | ICD-10-CM

## 2017-12-07 DIAGNOSIS — E785 Hyperlipidemia, unspecified: Secondary | ICD-10-CM

## 2017-12-07 NOTE — Telephone Encounter (Signed)
Last prescribed on 11/06/2017 and 11/08/2017 Last office visit on 11/08/2017

## 2017-12-07 NOTE — Telephone Encounter (Signed)
Does she need a refill of furosemide? How often is she taking? Refill sent for atorvastatin.

## 2017-12-08 NOTE — Telephone Encounter (Signed)
Per DPR, left detail message of Kate Clark's comments for patient to call back 

## 2017-12-22 DIAGNOSIS — J439 Emphysema, unspecified: Secondary | ICD-10-CM | POA: Diagnosis not present

## 2017-12-22 DIAGNOSIS — Z1211 Encounter for screening for malignant neoplasm of colon: Secondary | ICD-10-CM | POA: Diagnosis not present

## 2017-12-22 DIAGNOSIS — J449 Chronic obstructive pulmonary disease, unspecified: Secondary | ICD-10-CM | POA: Diagnosis not present

## 2017-12-22 DIAGNOSIS — D124 Benign neoplasm of descending colon: Secondary | ICD-10-CM | POA: Diagnosis not present

## 2017-12-22 DIAGNOSIS — K635 Polyp of colon: Secondary | ICD-10-CM | POA: Diagnosis not present

## 2017-12-22 DIAGNOSIS — Z86010 Personal history of colonic polyps: Secondary | ICD-10-CM | POA: Diagnosis not present

## 2017-12-22 DIAGNOSIS — F17210 Nicotine dependence, cigarettes, uncomplicated: Secondary | ICD-10-CM | POA: Diagnosis not present

## 2017-12-22 DIAGNOSIS — D120 Benign neoplasm of cecum: Secondary | ICD-10-CM | POA: Diagnosis not present

## 2018-02-08 IMAGING — DX DG KNEE COMPLETE 4+V*L*
4 series · 4 of 4 positions shown · non-contrast
Comparison: None.

CLINICAL DATA: Fell and injured left knee today.

EXAM:
LEFT KNEE - COMPLETE 4+ VIEW

[knee ap]
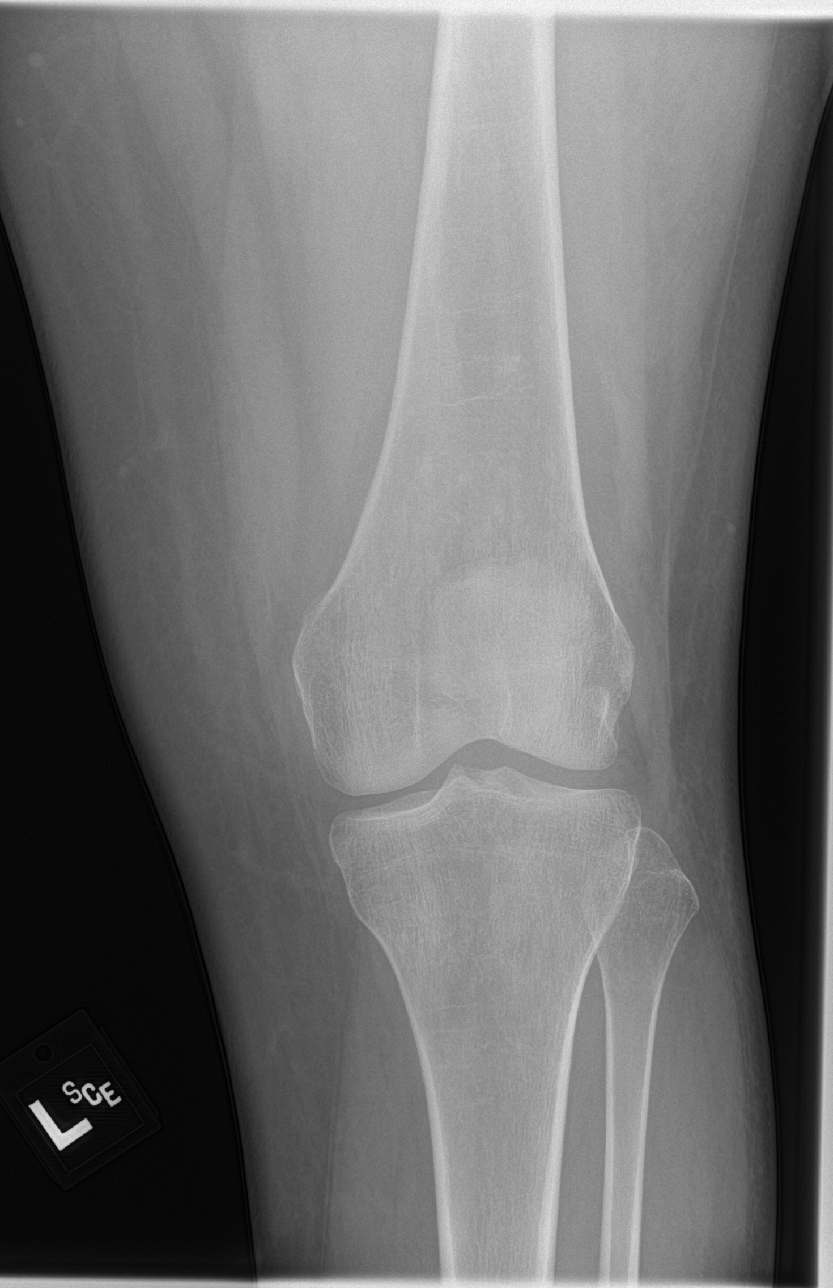

[knee obl (1 of 2)]
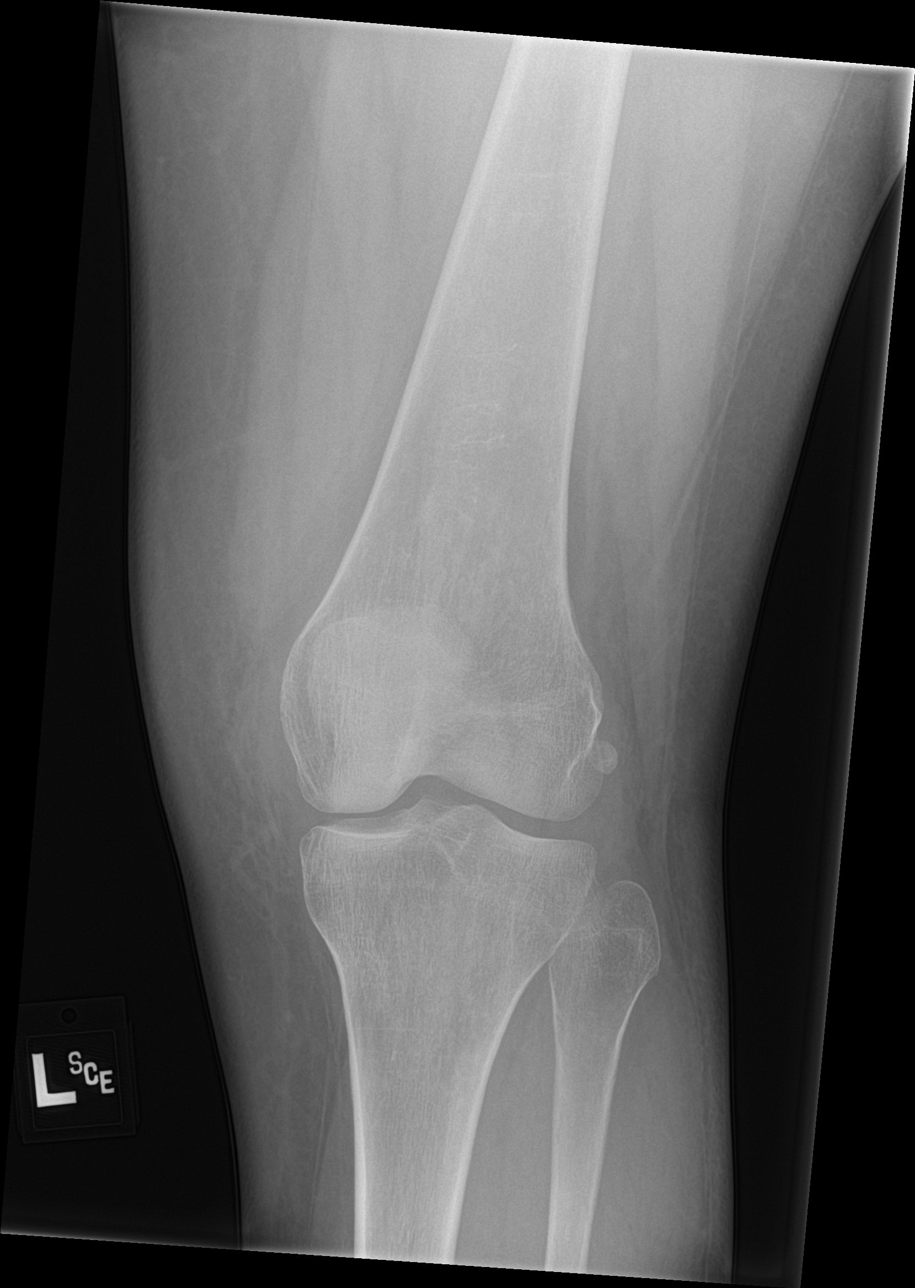

[knee obl (2 of 2)]
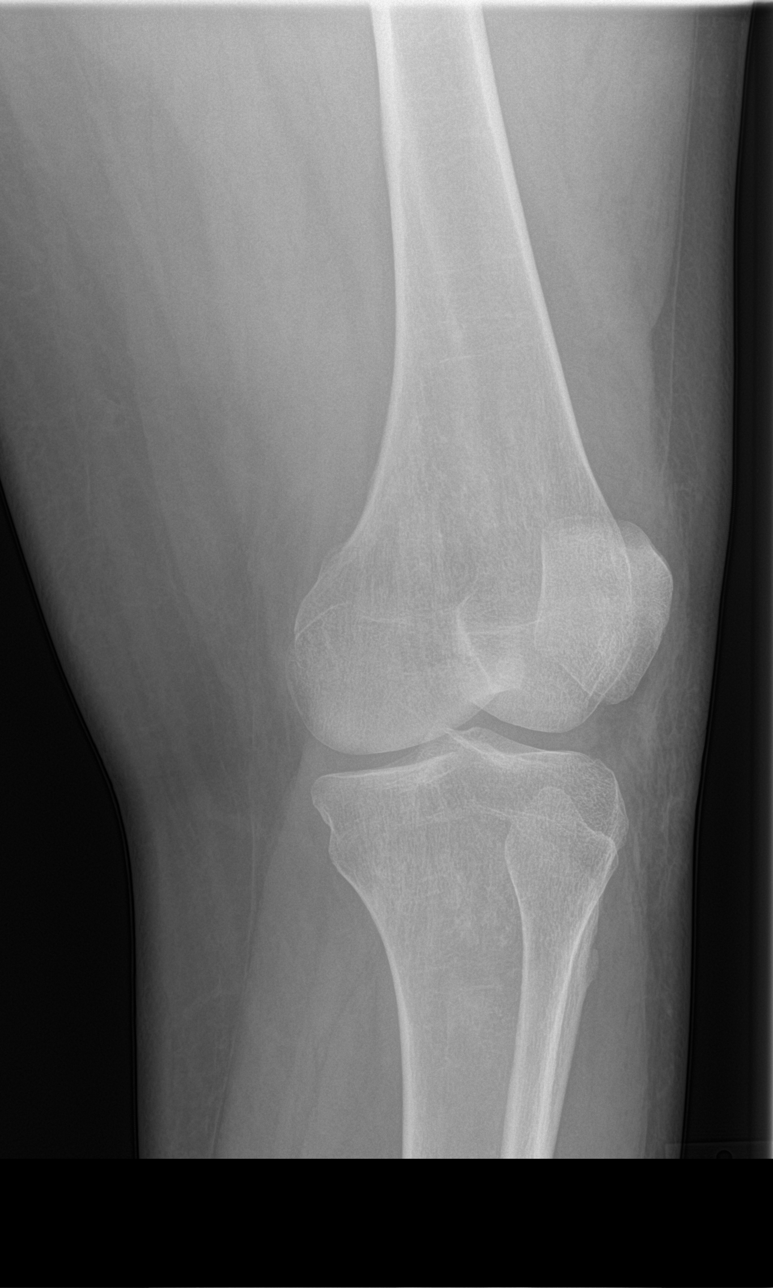

[knee lat]
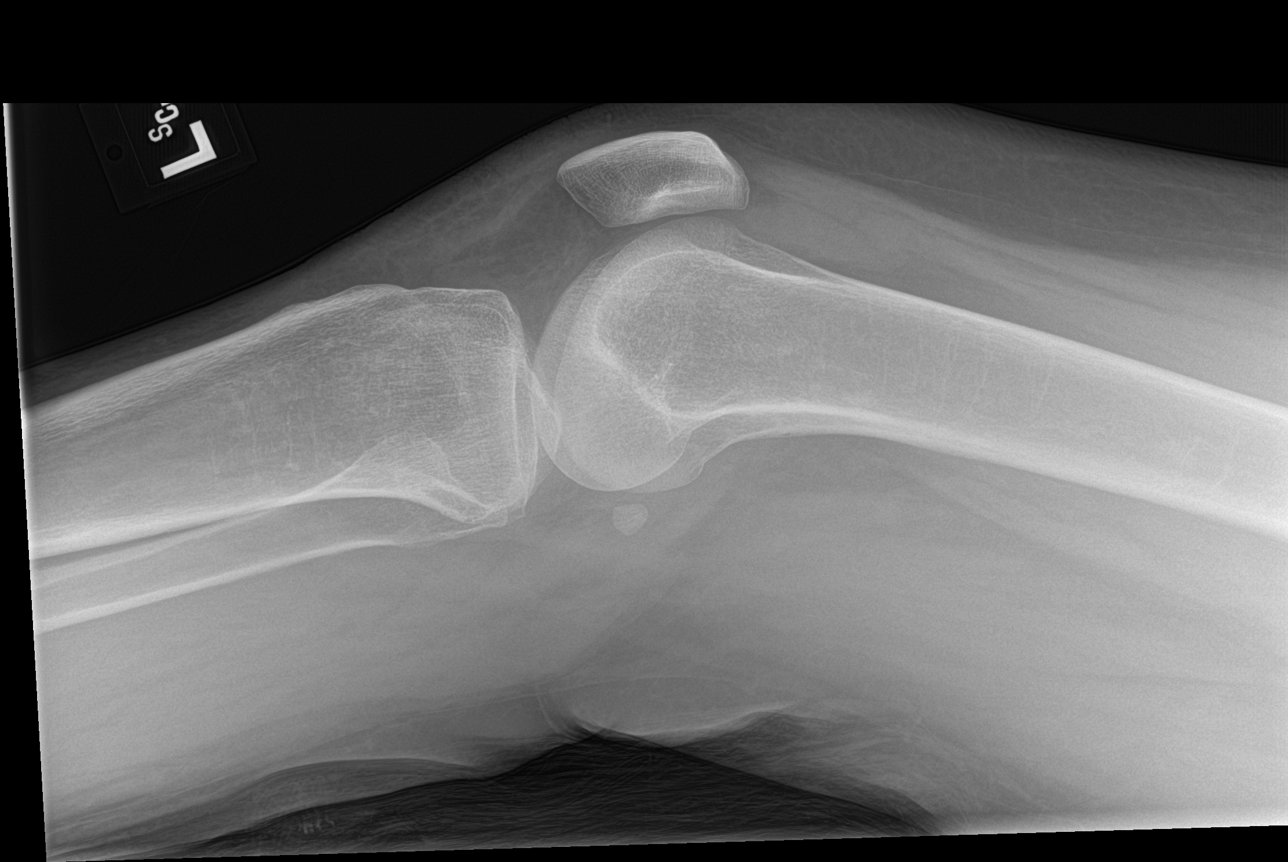

[4 of 4 positions shown; findings below may reference images not displayed]

FINDINGS: The joint spaces are maintained. No acute fracture. No osteochondral
abnormality. Small joint effusion.
IMPRESSION: No acute bony findings.  Small joint effusion.

## 2018-03-01 ENCOUNTER — Encounter: Payer: Self-pay | Admitting: Family Medicine

## 2018-03-01 ENCOUNTER — Ambulatory Visit: Payer: BLUE CROSS/BLUE SHIELD | Admitting: Family Medicine

## 2018-03-01 ENCOUNTER — Ambulatory Visit (INDEPENDENT_AMBULATORY_CARE_PROVIDER_SITE_OTHER): Payer: BLUE CROSS/BLUE SHIELD | Admitting: Family Medicine

## 2018-03-01 VITALS — BP 138/82 | HR 78 | Temp 97.4°F | Ht 61.0 in | Wt 142.5 lb

## 2018-03-01 DIAGNOSIS — R6889 Other general symptoms and signs: Secondary | ICD-10-CM

## 2018-03-01 DIAGNOSIS — L987 Excessive and redundant skin and subcutaneous tissue: Secondary | ICD-10-CM | POA: Diagnosis not present

## 2018-03-01 DIAGNOSIS — J069 Acute upper respiratory infection, unspecified: Secondary | ICD-10-CM | POA: Diagnosis not present

## 2018-03-01 DIAGNOSIS — B9789 Other viral agents as the cause of diseases classified elsewhere: Secondary | ICD-10-CM

## 2018-03-01 LAB — POCT INFLUENZA A/B
INFLUENZA A, POC: NEGATIVE
INFLUENZA B, POC: NEGATIVE

## 2018-03-01 NOTE — Progress Notes (Signed)
Subjective:     Dawn Hays is a 43 y.o. female presenting for URI (x 2 days. Ears muffled, headache-hurts to touch, back pain, congestion, nauseas, green mucus coming out from her nose when blowing it, slight cough. Fever-highest 100.3.) and Spot on right arm (x 2 weeks. Growing in size.)     URI   This is a new problem. The current episode started in the past 7 days. The maximum temperature recorded prior to her arrival was 100.4 - 100.9 F. Associated symptoms include abdominal pain, chest pain, congestion, coughing, ear pain, headaches, nausea, a plugged ear sensation and rhinorrhea. Pertinent negatives include no diarrhea, sinus pain, sore throat or vomiting.   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  HENT: Positive for congestion, ear pain and rhinorrhea. Negative for sinus pain and sore throat.   Respiratory: Positive for cough.   Cardiovascular: Positive for chest pain.  Gastrointestinal: Positive for abdominal pain and nausea. Negative for constipation, diarrhea and vomiting.  Musculoskeletal: Positive for arthralgias and myalgias.  Neurological: Positive for headaches.     Social History   Tobacco Use  Smoking Status Current Every Day Smoker  Smokeless Tobacco Never Used        Objective:    BP Readings from Last 3 Encounters:  03/01/18 138/82  11/08/17 118/72  02/13/17 122/76   Wt Readings from Last 3 Encounters:  03/01/18 142 lb 8 oz (64.6 kg)  11/08/17 147 lb 8 oz (66.9 kg)  02/13/17 166 lb 12.8 oz (75.7 kg)    BP 138/82   Pulse 78   Temp (!) 97.4 F (36.3 C)   Ht 5\' 1"  (1.549 m)   Wt 142 lb 8 oz (64.6 kg)   SpO2 99%   BMI 26.93 kg/m    Physical Exam Constitutional:      General: She is not in  acute distress.    Appearance: She is well-developed. She is not diaphoretic.  HENT:     Head: Normocephalic and atraumatic.     Right Ear: Tympanic membrane and ear canal normal.     Left Ear: Tympanic membrane and ear canal normal.     Nose: Mucosal edema and rhinorrhea present.     Right Sinus: No maxillary sinus tenderness or frontal sinus tenderness.     Left Sinus: No maxillary sinus tenderness or frontal sinus tenderness.     Mouth/Throat:     Pharynx: Uvula midline. Posterior oropharyngeal erythema present. No oropharyngeal exudate.     Tonsils: Swelling: 0 on the right. 0 on the left.  Eyes:     General: No scleral icterus.    Conjunctiva/sclera: Conjunctivae normal.  Neck:     Musculoskeletal: Neck supple.  Cardiovascular:     Rate and Rhythm: Normal rate and regular rhythm.     Heart sounds: Normal heart sounds. No murmur.  Pulmonary:     Effort: Pulmonary effort is normal. No respiratory distress.     Breath sounds: Normal breath sounds.  Lymphadenopathy:     Cervical: No cervical adenopathy.  Skin:    General: Skin is warm and dry.     Capillary Refill: Capillary refill takes less than 2 seconds.  Comments: Right inner elbow: flesh colored loose skin approximately 1-2 cm.   Neurological:     Mental Status: She is alert.     Rapid flu: negative      Assessment & Plan:   Problem List Items Addressed This Visit    None    Visit Diagnoses    Viral URI with cough    -  Primary   Flu-like symptoms       Relevant Orders   POCT Influenza A/B (Completed)   Excess skin         Skin lesion: no skin changes, appears to be excess of skin of unknown origin. Reassurance. Recommend watch and wait approach. Follow-up if not improving and could plan for derm referral  URI - flu negative. Symptomatic care.    Return if symptoms worsen or fail to improve.  Lynnda Child, MD

## 2018-03-01 NOTE — Patient Instructions (Addendum)
#  Spot on Arm - looks like excess skin - not concerning at this time - would continue to monitor it, if increasing in size or worsening -- let me or Jae Dire know and we can consider dermatology referral   Rapid flu was negative.   Based on your symptoms, it looks like you have a virus.   Antibiotics are not need for a viral infection but the following will help:   1. Drink plenty of fluids 2. Get lots of rest  Sinus Congestion 1) Neti Pot (Saline rinse) -- 2 times day -- if tolerated 2) Flonase (Store Brand ok) - once daily 3) Over the counter congestion medications  Cough 1) Cough drops can be helpful 2) Nyquil (or nighttime cough medication) 3) Honey is proven to be one of the best cough medications   Sore Throat 1) Honey as above, cough drops 2) Ibuprofen or Aleve can be helpful 3) Salt water Gargles  If you develop fevers (Temperature >100.4), chills, worsening symptoms or symptoms lasting longer than 10 days return to clinic.

## 2018-03-20 ENCOUNTER — Encounter: Payer: Self-pay | Admitting: Primary Care

## 2018-03-20 ENCOUNTER — Other Ambulatory Visit: Payer: Self-pay | Admitting: Primary Care

## 2018-03-20 ENCOUNTER — Ambulatory Visit (INDEPENDENT_AMBULATORY_CARE_PROVIDER_SITE_OTHER): Payer: BLUE CROSS/BLUE SHIELD | Admitting: Primary Care

## 2018-03-20 VITALS — BP 130/82 | HR 82 | Temp 97.9°F | Ht 61.0 in | Wt 142.2 lb

## 2018-03-20 DIAGNOSIS — J019 Acute sinusitis, unspecified: Secondary | ICD-10-CM

## 2018-03-20 DIAGNOSIS — M4317 Spondylolisthesis, lumbosacral region: Secondary | ICD-10-CM

## 2018-03-20 MED ORDER — AMOXICILLIN-POT CLAVULANATE 875-125 MG PO TABS: 1.0000 | ORAL_TABLET | Freq: Two times a day (BID) | ORAL | 0 refills | Status: DC

## 2018-03-20 NOTE — Telephone Encounter (Signed)
Medication have not been prescribed by Mayra Reel. Last seen today 03/20/2018

## 2018-03-20 NOTE — Progress Notes (Signed)
Subjective:    Patient ID: Dawn Hays, female    DOB: 09/11/1975, 43 y.o.   MRN: [id number]  HPI  Dawn Hays is a 43 year old female with a history of constipation, hypertension, chronic back pain who presents today with a chief complaint of sinus pressure.  She also reports nasal congestion, sinus pain, post nasal drip, sneezing, facial pain. Her symptoms began four days ago. She has run fevers of 98-102. She's been taking Tylenol every four hours and her temperature will increase after the Tylenol wears off. She was exposed to flu from a few girls at work. She's also been taking Advil Cold and Sinus, Flonase, saline nasal spray without improvement.   She was last evaluated on 03/01/18 for a two day history of cough, headache, ear fullness, congestion, fevers. Fevers were 100.4-100.9 recorded by patient. She was tested for influenza which was negative. Symptoms were suspected to be viral in cause so she was treated with symptomatic care.   Since her last visit she endorsed continued symptoms, felt better for a few days, then symptoms returned again four days ago. She never felt completely resolved from her prior visit but did feel better. She will lean forward and notice thick green mucous from her nasal cavity. Her most bothersome symptom is facial pain with sinus pressure.  Review of Systems  Constitutional: Positive for chills, fatigue and fever.  HENT: Positive for congestion, postnasal drip and sinus pressure. Negative for sore throat.   Respiratory: Positive for cough. Negative for shortness of breath.        Past Medical History:  Diagnosis Date  . Chickenpox   . Chronic idiopathic constipation   . Environmental and seasonal allergies   . Hot flashes due to surgical menopause   . Hyperlipidemia   . Hypertension   . Migraines   . Plaque psoriasis   . Pulmonary emphysema (HCC)   . Spondylolisthesis at L5-S1 level    Grade IV     Social History   Socioeconomic History    . Marital status: Married    Spouse name: Not on file  . Number of children: Not on file  . Years of education: Not on file  . Highest education level: Not on file  Occupational History  . Not on file  Social Needs  . Financial resource strain: Not on file  . Food insecurity:    Worry: Not on file    Inability: Not on file  . Transportation needs:    Medical: Not on file    Non-medical: Not on file  Tobacco Use  . Smoking status: Current Every Day Smoker  . Smokeless tobacco: Never Used  Substance and Sexual Activity  . Alcohol use: No  . Drug use: No  . Sexual activity: Not on file  Lifestyle  . Physical activity:    Days per week: Not on file    Minutes per session: Not on file  . Stress: Not on file  Relationships  . Social connections:    Talks on phone: Not on file    Gets together: Not on file    Attends religious service: Not on file    Active member of club or organization: Not on file    Attends meetings of clubs or organizations: Not on file    Relationship status: Not on file  . Intimate partner violence:    Fear of current or ex partner: Not on file    Emotionally abused: Not on file  Physically abused: Not on file    Forced sexual activity: Not on file  Other Topics Concern  . Not on file  Social History Narrative  . Not on file    Past Surgical History:  Procedure Laterality Date  . ABDOMINAL HYSTERECTOMY  05/03/2006   Partial, ovaries remain  . ANORECTAL MANOMETRY  2017  . CESAREAN SECTION  12/13/1997  . CESAREAN SECTION  08/31/2004  . COLONOSCOPY  10/21/2015  . COLONOSCOPY  10/21/2016  . DILATION AND CURETTAGE OF UTERUS  2006  . LAPAROSCOPY  10/11/2002   endometriosis  . PLANTAR'S WART EXCISION    . TOOTH EXTRACTION  03/200  . UMBILICAL HERNIA REPAIR  2014    Family History  Problem Relation Age of Onset  . COPD Mother   . Arthritis Maternal Grandmother   . Hypertension Maternal Grandmother   . Kidney disease Maternal Grandmother      Allergies  Allergen Reactions  . Sumatriptan Shortness Of Breath and Swelling  . Benzocaine-Resorcinol Swelling  . Latex Swelling  . Molds & Smuts     Other reaction(s): Other (See Comments) Other reaction(s): Sneezing  . Other     Other reaction(s): Other (See Comments) Household dust, dust mites, mold   . Pramoxine Swelling  . Prednisone Swelling    Other reaction(s): Cough (ALLERGY/intolerance), Coughing, Respiratory Depression Other reaction(s): Respiratory Depression Allergic to pills and injections Allergic to pills and injections   . Promethazine     Other reaction(s): Cough (ALLERGY/intolerance), Coughing Other reaction(s): Coughing   . Sumatriptan Succinate     Other reaction(s): Cough (ALLERGY/intolerance), Respiratory Depression    Current Outpatient Medications on File Prior to Visit  Medication Sig Dispense Refill  . atorvastatin (LIPITOR) 40 MG tablet TAKE 1 TABLET BY MOUTH EVERY WEEK 12 tablet 3  . Calcium Citrate (CITRACAL PO) Take 2 capsules by mouth 2 (two) times daily.    Marland Kitchen. CALCIUM PO Take 1,000 mg by mouth daily.    . celecoxib (CELEBREX) 200 MG capsule Take 200 mg by mouth 2 (two) times daily.    . Cetirizine-Pseudoephedrine (ZYRTEC-D ALLERGY & CONGESTION PO) Take 1 tablet by mouth daily as needed.    . Cholecalciferol (VITAMIN D PO) Take 5,000 Units by mouth daily.    . clobetasol (TEMOVATE) 0.05 % external solution Apply 1 application topically 2 (two) times daily.    . clobetasol ointment (TEMOVATE) 0.05 % Apply 1 application topically 2 (two) times daily.    . DULoxetine (CYMBALTA) 60 MG capsule Take 60 mg by mouth daily.    Marland Kitchen. estradiol (CLIMARA - DOSED IN MG/24 HR) 0.025 mg/24hr patch Place 0.025 mg onto the skin once a week.    . furosemide (LASIX) 20 MG tablet Take 1 tablet by mouth once daily for 5 days, then take as needed. 30 tablet 0  . gabapentin (NEURONTIN) 300 MG capsule Taking 900 mg in the morning, 900 mg in the afternoon, and 900 mg  at bedtime    . methocarbamol (ROBAXIN) 750 MG tablet Take 750 mg by mouth 3 (three) times daily as needed.    . montelukast (SINGULAIR) 10 MG tablet TAKE 1 TABLET BY MOUTH AT BEDTIME FOR ALLERGIES 90 tablet 1  . Plecanatide (TRULANCE PO) Take 3 mg by mouth daily.    . Secukinumab (COSENTYX SENSOREADY PEN) 150 MG/ML SOAJ Inject 300 mg into skin every 4 weeks     No current facility-administered medications on file prior to visit.     BP 130/82  Pulse 82   Temp 97.9 F (36.6 C) (Oral)   Ht 5\' 1"  (1.549 m)   Wt 142 lb 4 oz (64.5 kg)   SpO2 99%   BMI 26.88 kg/m    Objective:   Physical Exam  Constitutional: She appears well-nourished. She appears ill.  HENT:  Right Ear: Tympanic membrane and ear canal normal.  Left Ear: Tympanic membrane and ear canal normal.  Nose: Mucosal edema present. Right sinus exhibits maxillary sinus tenderness. Right sinus exhibits no frontal sinus tenderness. Left sinus exhibits maxillary sinus tenderness. Left sinus exhibits no frontal sinus tenderness.  Mouth/Throat: Oropharynx is clear and moist.  Neck: Neck supple.  Cardiovascular: Normal rate and regular rhythm.  Respiratory: Effort normal and breath sounds normal. She has no wheezes.  Skin: Skin is warm and dry.           Assessment & Plan:  Acute Sinusitis:  Intermittent symptoms since 02/27/18, overall feeling worse now. Exam with sinus tenderness.  Negative flu today. Given duration of symptoms coupled with exam and presentation, will treat. Rx for Augmentin course sent to pharmacy. Discussed other OTC treatment. Fluids, rest, follow up PRN.  Doreene Nest, NP

## 2018-03-20 NOTE — Patient Instructions (Addendum)
Start Augmentin antibiotics for the infection Take 1 tablet by mouth twice daily for 10 days.  Continue saline nasal spray.  It was a pleasure to see you today!

## 2018-03-20 NOTE — Telephone Encounter (Signed)
Noted, refill sent to pharmacy. 

## 2018-04-23 DIAGNOSIS — Z5181 Encounter for therapeutic drug level monitoring: Secondary | ICD-10-CM | POA: Diagnosis not present

## 2018-04-23 DIAGNOSIS — L409 Psoriasis, unspecified: Secondary | ICD-10-CM | POA: Diagnosis not present

## 2018-04-23 DIAGNOSIS — L4050 Arthropathic psoriasis, unspecified: Secondary | ICD-10-CM | POA: Diagnosis not present

## 2018-05-06 ENCOUNTER — Other Ambulatory Visit: Payer: Self-pay | Admitting: Primary Care

## 2018-05-06 DIAGNOSIS — M4317 Spondylolisthesis, lumbosacral region: Secondary | ICD-10-CM

## 2018-05-07 NOTE — Telephone Encounter (Signed)
Last prescribed on 03/20/2018. Last office visit on 03/20/2018. No future appointment

## 2018-05-07 NOTE — Telephone Encounter (Signed)
Does she actually need a refill of the methocarbamol muscle relaxer? How often is she taking?

## 2018-05-08 NOTE — Telephone Encounter (Signed)
Message left for patient to return my call.  

## 2018-05-10 NOTE — Telephone Encounter (Signed)
Message left for patient to return my call.  

## 2018-05-14 ENCOUNTER — Other Ambulatory Visit: Payer: Self-pay | Admitting: Primary Care

## 2018-05-14 DIAGNOSIS — M4317 Spondylolisthesis, lumbosacral region: Secondary | ICD-10-CM

## 2018-05-14 NOTE — Telephone Encounter (Signed)
Requested medication (s) are due for refill today: yes  Requested medication (s) are on the active medication list: yes  Last refill:  03/20/18  Future visit scheduled: no  Notes to clinic:  Medication (muscle relaxants) not delegated to NT to refill   Requested Prescriptions  Pending Prescriptions Disp Refills   methocarbamol (ROBAXIN) 750 MG tablet 90 tablet 0    Sig: TAKE 1 TABLET(750 MG) BY MOUTH THREE TIMES DAILY FOR UP TO 10 DAYS AS NEEDED     There is no refill protocol information for this order

## 2018-05-14 NOTE — Telephone Encounter (Signed)
Will refuse since patient did not response

## 2018-05-15 ENCOUNTER — Other Ambulatory Visit: Payer: Self-pay | Admitting: Primary Care

## 2018-05-15 DIAGNOSIS — M4317 Spondylolisthesis, lumbosacral region: Secondary | ICD-10-CM

## 2018-05-15 MED ORDER — METHOCARBAMOL 750 MG PO TABS: ORAL_TABLET | ORAL | 0 refills | Status: DC

## 2018-05-15 NOTE — Telephone Encounter (Signed)
Noted, refill sent to pharmacy. 

## 2018-05-15 NOTE — Telephone Encounter (Signed)
Best number   Cell [phone] Hurley Cisco [phone]  Lorin Picket spouse returned your call

## 2018-05-15 NOTE — Telephone Encounter (Signed)
Spoken to patient's husband since patient is working and can't call back on time when we are open.   Patient would like a refill of methocarbamol (ROBAXIN) 750 MG tablet and will like to have additional refills. This really is helping when she  needs it.  Patient's husband does not know how often she take but she takes in most day each week.

## 2018-06-16 ENCOUNTER — Other Ambulatory Visit: Payer: Self-pay | Admitting: Primary Care

## 2018-06-16 DIAGNOSIS — J3089 Other allergic rhinitis: Secondary | ICD-10-CM

## 2018-07-09 ENCOUNTER — Other Ambulatory Visit: Payer: Self-pay | Admitting: Primary Care

## 2018-07-09 DIAGNOSIS — M4317 Spondylolisthesis, lumbosacral region: Secondary | ICD-10-CM

## 2018-08-20 ENCOUNTER — Other Ambulatory Visit: Payer: Self-pay | Admitting: Primary Care

## 2018-08-20 DIAGNOSIS — M4317 Spondylolisthesis, lumbosacral region: Secondary | ICD-10-CM

## 2018-08-21 ENCOUNTER — Other Ambulatory Visit: Payer: Self-pay | Admitting: Primary Care

## 2018-08-21 DIAGNOSIS — M4317 Spondylolisthesis, lumbosacral region: Secondary | ICD-10-CM

## 2018-08-22 NOTE — Telephone Encounter (Signed)
Mr Argueta [facility] signed) requesting refill on methocarbamol to [facility] [address] [facility] [address]. Pt request refill due to spondylolisthesis and back pain due to alignment of spine. Last annual was 11/08/17. Last refilled # 90 on 07/10/18.No future appt scheduled.Please advise.

## 2018-08-22 NOTE — Telephone Encounter (Signed)
Noted, refill sent to pharmacy. We will need to monitor refill request.

## 2018-09-23 ENCOUNTER — Other Ambulatory Visit: Payer: Self-pay | Admitting: Primary Care

## 2018-09-23 DIAGNOSIS — M4317 Spondylolisthesis, lumbosacral region: Secondary | ICD-10-CM

## 2018-11-14 ENCOUNTER — Telehealth: Payer: Self-pay | Admitting: Primary Care

## 2018-11-14 NOTE — Telephone Encounter (Signed)
Please notify patient and her husband that she is due for follow-up as I have not seen her since October 2019. I will be happy to provide continuous refills once she has been evaluated.  Based off of our prior conversation the patient endorsed taking 1 tablet 3 times daily for up to 10 days if needed.  This is why 90 tablets have always been called in.  If she is completely out of methocarbamol, please clarify how often she is taking. I can send a temporary refill. Please schedule follow-up office visit for further refills.

## 2018-11-14 NOTE — Telephone Encounter (Signed)
Spoke with Mr Jefferys. He states there is sometimes a gap in days in getting patient's medication filled and then there is an issue with insurance filling it. I asked if its about Methocarbamol and he said he thinks so and other medications he thought. I advised patient I see that Atorvastatin gets filled by [place] on regular basis but not her other medications. I did try to call patient earlier but she did not answer. I called pharmacy and spoke with Audelia Acton to see if there were any issues with Methocarbamol but he did not see any except that they do not have a refill on file. Husband wants to know if there could be extra refills sent in for Methocarbamol so it can be automatically refilled?

## 2018-11-14 NOTE — Telephone Encounter (Signed)
Per DPR, left detail message of Kate Clark's comments for patient to call back 

## 2018-11-14 NOTE — Telephone Encounter (Signed)
Called both numbers and left a message for call back. Want to verify which medication they are asking about (methocarbamol?)-ah

## 2018-11-14 NOTE — Telephone Encounter (Signed)
Pt's husband calling wants to know if prescriptions has to be called in every month b/c it causes a delay. Wants a call back.  CB [phone]  [phone] is wife's number but she might not answer

## 2018-11-15 NOTE — Telephone Encounter (Signed)
Per DPR, left detail message of Kate Clark's comments for patient to call back 

## 2018-11-19 NOTE — Telephone Encounter (Signed)
Sending letter to patient of Dawn Hays comments.

## 2018-11-26 ENCOUNTER — Other Ambulatory Visit: Payer: Self-pay

## 2018-11-26 ENCOUNTER — Emergency Department
Admission: EM | Admit: 2018-11-26 | Discharge: 2018-11-26 | Disposition: A | Discharge status: Home / Self Care | Payer: BC Managed Care – PPO | Attending: Emergency Medicine | Admitting: Emergency Medicine

## 2018-11-26 ENCOUNTER — Encounter: Payer: Self-pay | Admitting: Intensive Care

## 2018-11-26 ENCOUNTER — Emergency Department: Payer: BC Managed Care – PPO

## 2018-11-26 DIAGNOSIS — F17200 Nicotine dependence, unspecified, uncomplicated: Secondary | ICD-10-CM | POA: Diagnosis not present

## 2018-11-26 DIAGNOSIS — R079 Chest pain, unspecified: Secondary | ICD-10-CM | POA: Diagnosis not present

## 2018-11-26 DIAGNOSIS — R1010 Upper abdominal pain, unspecified: Secondary | ICD-10-CM | POA: Diagnosis not present

## 2018-11-26 DIAGNOSIS — Z79899 Other long term (current) drug therapy: Secondary | ICD-10-CM | POA: Diagnosis not present

## 2018-11-26 DIAGNOSIS — I10 Essential (primary) hypertension: Secondary | ICD-10-CM | POA: Diagnosis not present

## 2018-11-26 DIAGNOSIS — Z91040 Latex allergy status: Secondary | ICD-10-CM | POA: Diagnosis not present

## 2018-11-26 DIAGNOSIS — M549 Dorsalgia, unspecified: Secondary | ICD-10-CM | POA: Diagnosis not present

## 2018-11-26 DIAGNOSIS — R0789 Other chest pain: Secondary | ICD-10-CM | POA: Diagnosis not present

## 2018-11-26 LAB — BASIC METABOLIC PANEL
Anion gap: 6 (ref 5–15)
BUN: <5 mg/dL — ABNORMAL LOW (ref 6–20)
CO2: 25 mmol/L (ref 22–32)
Calcium: 8.9 mg/dL (ref 8.9–10.3)
Chloride: 107 mmol/L (ref 98–111)
Creatinine, Ser: 0.74 mg/dL (ref 0.44–1.00)
GFR calc Af Amer: >60 mL/min (ref >60)
GFR calc non Af Amer: >60 mL/min (ref >60)
Glucose, Bld: 108 mg/dL — ABNORMAL HIGH (ref 70–99)
Potassium: 4.1 mmol/L (ref 3.5–5.1)
Sodium: 138 mmol/L (ref 135–145)

## 2018-11-26 LAB — CBC
HCT: 37.1 % (ref 36.0–46.0)
Hemoglobin: 12.6 g/dL (ref 12.0–15.0)
MCH: 29.9 pg (ref 26.0–34.0)
MCHC: 34 g/dL (ref 30.0–36.0)
MCV: 88.1 fL (ref 80.0–100.0)
Platelets: 295 10*3/uL (ref 150–400)
RBC: 4.21 MIL/uL (ref 3.87–5.11)
RDW: 11.7 % (ref 11.5–15.5)
WBC: 10.8 10*3/uL — ABNORMAL HIGH (ref 4.0–10.5)
nRBC: 0 % (ref 0.0–0.2)

## 2018-11-26 LAB — TROPONIN I (HIGH SENSITIVITY): Troponin I (High Sensitivity): <2 ng/L (ref <18)

## 2018-11-26 MED ORDER — KETOROLAC TROMETHAMINE 30 MG/ML IJ SOLN
30.0000 mg | Freq: Once | INTRAMUSCULAR | Status: AC
  Administered 2018-11-26: 30 mg via INTRAMUSCULAR
  Filled 2018-11-26: qty 1

## 2018-11-26 MED ORDER — NAPROXEN 500 MG PO TABS: 500.0000 mg | ORAL_TABLET | Freq: Two times a day (BID) | ORAL | 2 refills | Status: DC

## 2018-11-26 NOTE — ED Triage Notes (Signed)
Patient c/o sharp right sided shoulder blade pain that radiates under arm to right breast/chest since saturday

## 2018-11-26 NOTE — ED Notes (Signed)
Pt alert and oriented X 4, stable for discharge. RR even and unlabored, color WNL. Discussed discharge instructions and follow up when appropriate. Instructed to follow up with ER for any life threatening symptoms or concerns that patient or family of patient may have  

## 2018-11-26 NOTE — ED Provider Notes (Signed)
[facility] Emergency Department Provider Note   ____________________________________________    I have reviewed the triage vital signs and the nursing notes.   HISTORY  Chief Complaint Chest Pain     HPI Dawn Hays is a 43 y.o. female who presents with complaints of pain radiating from her upper back under her armpit to her chest for 2 days now.  She denies shortness of breath.  No pleurisy.  No nausea vomiting or diaphoresis.  No history of cardiac disease.  Has not take anything for this.  Does have a history of chronic back issues.  No neck pain.  Past Medical History:  Diagnosis Date  . Chickenpox   . Chronic idiopathic constipation   . Environmental and seasonal allergies   . Hot flashes due to surgical menopause   . Hyperlipidemia   . Hypertension   . Migraines   . Plaque psoriasis   . Pulmonary emphysema (HCC)   . Spondylolisthesis at L5-S1 level    Grade IV    Patient Active Problem List   Diagnosis Date Noted  . 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 Preventative health care 11/08/2017  . Essential hypertension 02/13/2017  . Lower extremity edema 02/13/2017  . Hyperlipidemia 02/13/2017  . Plaque psoriasis 02/13/2017  . Spondylolisthesis at L5-S1 level 02/13/2017  . Hot flashes due to surgical menopause 02/13/2017  . Chronic idiopathic constipation 02/13/2017  . Environmental and seasonal allergies 02/13/2017    Past Surgical History:  Procedure Laterality Date  . ABDOMINAL HYSTERECTOMY  05/03/2006   Partial, ovaries remain  . ANORECTAL MANOMETRY  2017  . CESAREAN SECTION  12/13/1997  . CESAREAN SECTION  08/31/2004  . COLONOSCOPY  10/21/2015  . COLONOSCOPY  10/21/2016  . DILATION AND CURETTAGE OF UTERUS  2006  . LAPAROSCOPY  10/11/2002   endometriosis  . PLANTAR'S WART EXCISION    . TOOTH EXTRACTION  03/200  . UMBILICAL HERNIA REPAIR  2014    Prior to Admission medications   Medication Sig Start Date End Date  Taking? Authorizing Provider  amoxicillin-clavulanate (AUGMENTIN) 875-125 MG tablet Take 1 tablet by mouth 2 (two) times daily. 03/20/18   Doreene Nestlark, Katherine K, NP  atorvastatin (LIPITOR) 40 MG tablet TAKE 1 TABLET BY MOUTH EVERY WEEK 12/07/17   Doreene Nestlark, Katherine K, NP  Calcium Citrate (CITRACAL PO) Take 2 capsules by mouth 2 (two) times daily.    [provider]  CALCIUM PO Take 1,000 mg by mouth daily.    [provider]  celecoxib (CELEBREX) 200 MG capsule Take 200 mg by mouth 2 (two) times daily.    [provider]  Cetirizine-Pseudoephedrine (ZYRTEC-D ALLERGY & CONGESTION PO) Take 1 tablet by mouth daily as needed.    [provider]  Cholecalciferol (VITAMIN D PO) Take 5,000 Units by mouth daily.    [provider]  clobetasol (TEMOVATE) 0.05 % external solution Apply 1 application topically 2 (two) times daily.    [provider]  clobetasol ointment (TEMOVATE) 0.05 % Apply 1 application topically 2 (two) times daily.    [provider]  DULoxetine (CYMBALTA) 60 MG capsule Take 60 mg by mouth daily.    [provider]  estradiol (CLIMARA - DOSED IN MG/24 HR) 0.025 mg/24hr patch Place 0.025 mg onto the skin once a week.    [provider]  furosemide (LASIX) 20 MG tablet Take 1 tablet by mouth once daily for 5 days, then take as needed. 11/08/17   Chestine Sporelark,  Keane Scrape, NP  gabapentin (NEURONTIN) 300 MG capsule Taking 900 mg in the morning, 900 mg in the afternoon, and 900 mg at bedtime    [provider]  methocarbamol (ROBAXIN) 750 MG tablet TAKE 1 TABLET(750 MG) BY MOUTH THREE TIMES DAILY FOR UP TO 10 DAYS AS NEEDED 09/25/18   Doreene Nest, NP  montelukast (SINGULAIR) 10 MG tablet TAKE 1 TABLET BY MOUTH AT BEDTIME FOR ALLERGIES 06/18/18   Doreene Nest, NP  naproxen (NAPROSYN) 500 MG tablet Take 1 tablet (500 mg total) by mouth 2 (two) times daily with a meal. 11/26/18   Jene Every, MD   Plecanatide (TRULANCE PO) Take 3 mg by mouth daily.    [provider]  Secukinumab (COSENTYX SENSOREADY PEN) 150 MG/ML SOAJ Inject 300 mg into skin every 4 weeks    [provider]     Allergies Sumatriptan, Benzocaine-resorcinol, Latex, Molds & smuts, Other, Pramoxine, Prednisone, Promethazine, and Sumatriptan succinate  Family History  Problem Relation Age of Onset  . COPD Mother   . Arthritis Maternal Grandmother   . Hypertension Maternal Grandmother   . Kidney disease Maternal Grandmother     Social History Social History   Tobacco Use  . Smoking status: Current Every Day Smoker  . Smokeless tobacco: Never Used  Substance Use Topics  . Alcohol use: No  . Drug use: No    Review of Systems  Constitutional: No fever/chills Eyes: No visual changes.  ENT: No sore throat. Cardiovascular: As above Respiratory: Denies shortness of breath. Gastrointestinal: No abdominal pain.  No nausea, no vomiting.   Genitourinary: Negative for dysuria. Musculoskeletal: As above Skin: Negative for rash. Neurological: Negative for headaches or weakness   ____________________________________________   PHYSICAL EXAM:  VITAL SIGNS: ED Triage Vitals  Enc Vitals Group     BP 11/26/18 1226 (!) 185/82     Pulse Rate 11/26/18 1226 73     Resp 11/26/18 1226 14     Temp 11/26/18 1226 98.2 F (36.8 C)     Temp Source 11/26/18 1226 Oral     SpO2 11/26/18 1226 100 %     Weight 11/26/18 1223 62.6 kg (138 lb)     Height 11/26/18 1223 1.664 m (5' 5.5")     Head Circumference --      Peak Flow --      Pain Score 11/26/18 1223 3     Pain Loc --      Pain Edu? --      Excl. in GC? --     Constitutional: Alert and oriented. No acute distress.  Nose: No congestion/rhinnorhea. Mouth/Throat: Mucous membranes are moist.    Cardiovascular: Normal rate, regular rhythm. Grossly normal heart sounds.  Good peripheral circulation.  No chest wall tenderness palpation  Respiratory: Normal respiratory effort.  No retractions. Lungs CTAB. Gastrointestinal: Soft and nontender. No distention.  No CVA tenderness.  Musculoskeletal: Back: No vertebral tenderness palpation.  Warm and well perfused Neurologic:  Normal speech and language. No gross focal neurologic deficits are appreciated.  Skin:  Skin is warm, dry and intact. No rash noted. Psychiatric: Mood and affect are normal. Speech and behavior are normal.  ____________________________________________   LABS (all labs ordered are listed, but only abnormal results are displayed)  Labs Reviewed  BASIC METABOLIC PANEL - Abnormal; Notable for the following components:      Result Value   Glucose, Bld 108 (*)    BUN <5 (*)    All other  components within normal limits  CBC - Abnormal; Notable for the following components:   WBC 10.8 (*)    All other components within normal limits  TROPONIN I (HIGH SENSITIVITY)   ____________________________________________  EKG  ED ECG REPORT I, Lavonia Drafts, the attending physician, personally viewed and interpreted this ECG.  Date: 11/26/2018  Rhythm: normal sinus rhythm QRS Axis: normal Intervals: normal ST/T Wave abnormalities: normal Narrative Interpretation: no evidence of acute ischemia  ____________________________________________  RADIOLOGY  Chest x-ray unremarkable, apical blebs ____________________________________________   PROCEDURES  Procedure(s) performed: No  Procedures   Critical Care performed: No ____________________________________________   INITIAL IMPRESSION / ASSESSMENT AND PLAN / ED COURSE  Pertinent labs & imaging results that were available during my care of the patient were reviewed by me and considered in my medical decision making (see chart for details).  Patient presents with pain as described above, EKG, troponin are normal.  Not consistent with ACS.  Not consistent with dissection or PE.  Suspect  radiculopathy given location and radiation of pain.  Recommended treat with prednisone but she states that she is allergic to prednisone.  Hence we will treat with NSAIDs, outpatient follow-up with PCP, return precautions if any worsening    ____________________________________________   FINAL CLINICAL IMPRESSION(S) / ED DIAGNOSES  Final diagnoses:  Musculoskeletal chest pain        Note:  This document was prepared using Dragon voice recognition software and may include unintentional dictation errors.   Lavonia Drafts, MD 11/26/18 2108

## 2019-03-25 ENCOUNTER — Other Ambulatory Visit: Payer: Self-pay | Admitting: Primary Care

## 2019-03-25 DIAGNOSIS — J3089 Other allergic rhinitis: Secondary | ICD-10-CM

## 2019-04-24 ENCOUNTER — Other Ambulatory Visit: Payer: Self-pay | Admitting: Primary Care

## 2019-04-24 DIAGNOSIS — J3089 Other allergic rhinitis: Secondary | ICD-10-CM

## 2019-04-29 DIAGNOSIS — L409 Psoriasis, unspecified: Secondary | ICD-10-CM | POA: Diagnosis not present

## 2019-04-29 DIAGNOSIS — Z5181 Encounter for therapeutic drug level monitoring: Secondary | ICD-10-CM | POA: Diagnosis not present

## 2019-04-29 DIAGNOSIS — Z79899 Other long term (current) drug therapy: Secondary | ICD-10-CM | POA: Diagnosis not present

## 2019-04-29 DIAGNOSIS — L4050 Arthropathic psoriasis, unspecified: Secondary | ICD-10-CM | POA: Diagnosis not present

## 2019-05-09 ENCOUNTER — Encounter: Payer: Self-pay | Admitting: Family Medicine

## 2019-05-09 ENCOUNTER — Ambulatory Visit (INDEPENDENT_AMBULATORY_CARE_PROVIDER_SITE_OTHER): Payer: BC Managed Care – PPO | Admitting: Family Medicine

## 2019-05-09 VITALS — Temp 98.7°F | Wt 142.0 lb

## 2019-05-09 DIAGNOSIS — J069 Acute upper respiratory infection, unspecified: Secondary | ICD-10-CM

## 2019-05-09 DIAGNOSIS — J3089 Other allergic rhinitis: Secondary | ICD-10-CM | POA: Diagnosis not present

## 2019-05-09 MED ORDER — HYDROCOD POLST-CPM POLST ER 10-8 MG/5ML PO SUER: 5.0000 mL | Freq: Two times a day (BID) | ORAL | 0 refills | Status: AC | PRN

## 2019-05-09 MED ORDER — BENZONATATE 100 MG PO CAPS: 100.0000 mg | ORAL_CAPSULE | Freq: Three times a day (TID) | ORAL | 0 refills | Status: AC | PRN

## 2019-05-09 MED ORDER — MONTELUKAST SODIUM 10 MG PO TABS: 10.0000 mg | ORAL_TABLET | Freq: Every day | ORAL | 0 refills | Status: DC

## 2019-05-09 NOTE — Progress Notes (Signed)
I connected with Dawn Hays on 05/09/19 at 12:00 PM EDT by video and verified that I am speaking with the correct person using two identifiers.   I discussed the limitations, risks, security and privacy concerns of performing an evaluation and management service by video and the availability of in person appointments. I also discussed with the patient that there may be a patient responsible charge related to this service. The patient expressed understanding and agreed to proceed.  Patient location: Home Provider Location: [facility] Participants: Lesleigh Noe and Charmian Muff   Subjective:     Dawn Hays is a 43 y.o. female presenting for Sore Throat (x 3 days. Hard cough, chest feels heavy, headaches, body aches. No fever.)     Sore Throat  This is a new problem. The current episode started in the past 7 days. The problem has been gradually worsening. Neither side of throat is experiencing more pain than the other. There has been no fever. The pain is severe. Associated symptoms include coughing, headaches, shortness of breath (when active) and trouble swallowing. Pertinent negatives include no congestion, diarrhea, ear pain, neck pain or vomiting.   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  HENT: Positive for postnasal drip and trouble swallowing. Negative for congestion and ear pain.   Eyes: Negative for itching.  Respiratory: Positive for cough and shortness of breath (when active).   Gastrointestinal: Negative for diarrhea and vomiting.  Musculoskeletal: Positive for arthralgias and  myalgias. Negative for neck pain.  Neurological: Positive for headaches.     Social History   Tobacco Use  Smoking Status Current Every Day Smoker  Smokeless Tobacco Never Used        Objective:   BP Readings from Last 3 Encounters:  11/26/18 (!) 165/70  03/20/18 130/82  03/01/18 138/82   Wt Readings from Last 3 Encounters:  05/09/19 142 lb (64.4 kg)  11/26/18 138 lb (62.6 kg)  03/20/18 142 lb 4 oz (64.5 kg)   Temp 98.7 F (37.1 C) Comment: per patient  Wt 142 lb (64.4 kg) Comment: per patient  BMI 23.27 kg/m    Physical Exam Constitutional:      Appearance: Normal appearance. She is not ill-appearing.  HENT:     Head: Normocephalic and atraumatic.     Right Ear: External ear normal.     Left Ear: External ear normal.  Eyes:     Conjunctiva/sclera: Conjunctivae normal.  Pulmonary:     Effort: Pulmonary effort is normal. No respiratory distress.  Neurological:     Mental Status: She is alert. Mental status is at baseline.  Psychiatric:        Mood and Affect: Mood normal.        Behavior: Behavior normal.        Thought Content: Thought content normal.        Judgment: Judgment normal.            Assessment & Plan:   Problem List Items Addressed This Visit      Other   Environmental and seasonal allergies   Relevant Medications  montelukast (SINGULAIR) 10 MG tablet    Other Visit Diagnoses    Viral URI with cough    -  Primary   Relevant Medications   benzonatate (TESSALON PERLES) 100 MG capsule   chlorpheniramine-HYDROcodone (TUSSIONEX PENNKINETIC ER) 10-8 MG/5ML SUER     No know hx of COPD but allergies. Notes no improvement with inhaler. Suspect likely virus with recent negative covid testing during symptoms.   Cough medication and symptomatic care. Return if not improved given smoking status and allergies may consider steroid course.   Return if symptoms worsen or fail to improve.  Lynnda Child, MD

## 2019-05-13 ENCOUNTER — Telehealth: Payer: Self-pay | Admitting: Primary Care

## 2019-05-13 NOTE — Telephone Encounter (Signed)
Spoke with patient. [facility] scheduled is not open for this evening. Advised patient to go to urgent care and patient understood.

## 2019-05-13 NOTE — Telephone Encounter (Signed)
Work note printed.  

## 2019-05-13 NOTE — Telephone Encounter (Signed)
[facility] advised and work note placed up front for pick up

## 2019-05-13 NOTE — Telephone Encounter (Signed)
Would recommend patient be evaluated at the respiratory clinic.   ?Sinus infection/pneumonia vs COPD exacerbation. Would benefit from in-person evaluation

## 2019-05-13 NOTE — Telephone Encounter (Signed)
Seen by Dr Selena Batten 05/09/19 for URI - advised to call back if not improving.  Pt states that she is getting worse, still having all same symptoms as her appt last week.   New symptoms started Friday - Pt started running a fever over the weekend of 101.7, eye congestion/discharge and redness, runny nose and vomiting mucous x 3 days.   Pharmacy [facility]

## 2019-05-13 NOTE — Telephone Encounter (Signed)
Pt husband calling for a work note for patient -- requesting it to okay her absence from work today 05/13/19 and Friday 05/10/19 - she plans to try and go back to work tomorrow.

## 2019-05-14 DIAGNOSIS — Z20822 Contact with and (suspected) exposure to covid-19: Secondary | ICD-10-CM | POA: Diagnosis not present

## 2019-05-14 DIAGNOSIS — J069 Acute upper respiratory infection, unspecified: Secondary | ICD-10-CM | POA: Diagnosis not present

## 2019-05-14 DIAGNOSIS — R05 Cough: Secondary | ICD-10-CM | POA: Diagnosis not present

## 2019-07-10 DIAGNOSIS — B948 Sequelae of other specified infectious and parasitic diseases: Secondary | ICD-10-CM | POA: Diagnosis not present

## 2019-07-10 DIAGNOSIS — R0602 Shortness of breath: Secondary | ICD-10-CM | POA: Diagnosis not present

## 2019-07-10 DIAGNOSIS — R05 Cough: Secondary | ICD-10-CM | POA: Diagnosis not present

## 2019-07-10 DIAGNOSIS — Z8616 Personal history of COVID-19: Secondary | ICD-10-CM | POA: Diagnosis not present

## 2019-07-22 ENCOUNTER — Other Ambulatory Visit: Payer: Self-pay | Admitting: Primary Care

## 2019-07-22 DIAGNOSIS — M4317 Spondylolisthesis, lumbosacral region: Secondary | ICD-10-CM

## 2019-07-22 NOTE — Telephone Encounter (Signed)
Last prescribed on 09/25/2018 Last OV (acute) with Mayra Reel on 03/20/2018 -- Dr Selena Batten on 05/09/2019 No future OV scheduled

## 2019-07-22 NOTE — Telephone Encounter (Signed)
Refill request denied. Patient has not seen me for follow up since February 2020. Needs office visit for refills.

## 2019-07-23 ENCOUNTER — Other Ambulatory Visit: Payer: Self-pay | Admitting: Family Medicine

## 2019-07-23 DIAGNOSIS — J3089 Other allergic rhinitis: Secondary | ICD-10-CM

## 2019-08-07 NOTE — Telephone Encounter (Signed)
lvm asking pt to call office °

## 2020-05-29 IMAGING — CR DG CHEST 2V
1 series · 2 of 2 positions shown · non-contrast
Comparison: None.

CLINICAL DATA: Chest pain

EXAM:
CHEST - 2 VIEW

[Series 1: dg chest 2 view · 0.14mm/px · 2 of 2 slices shown]
[im 1/2]
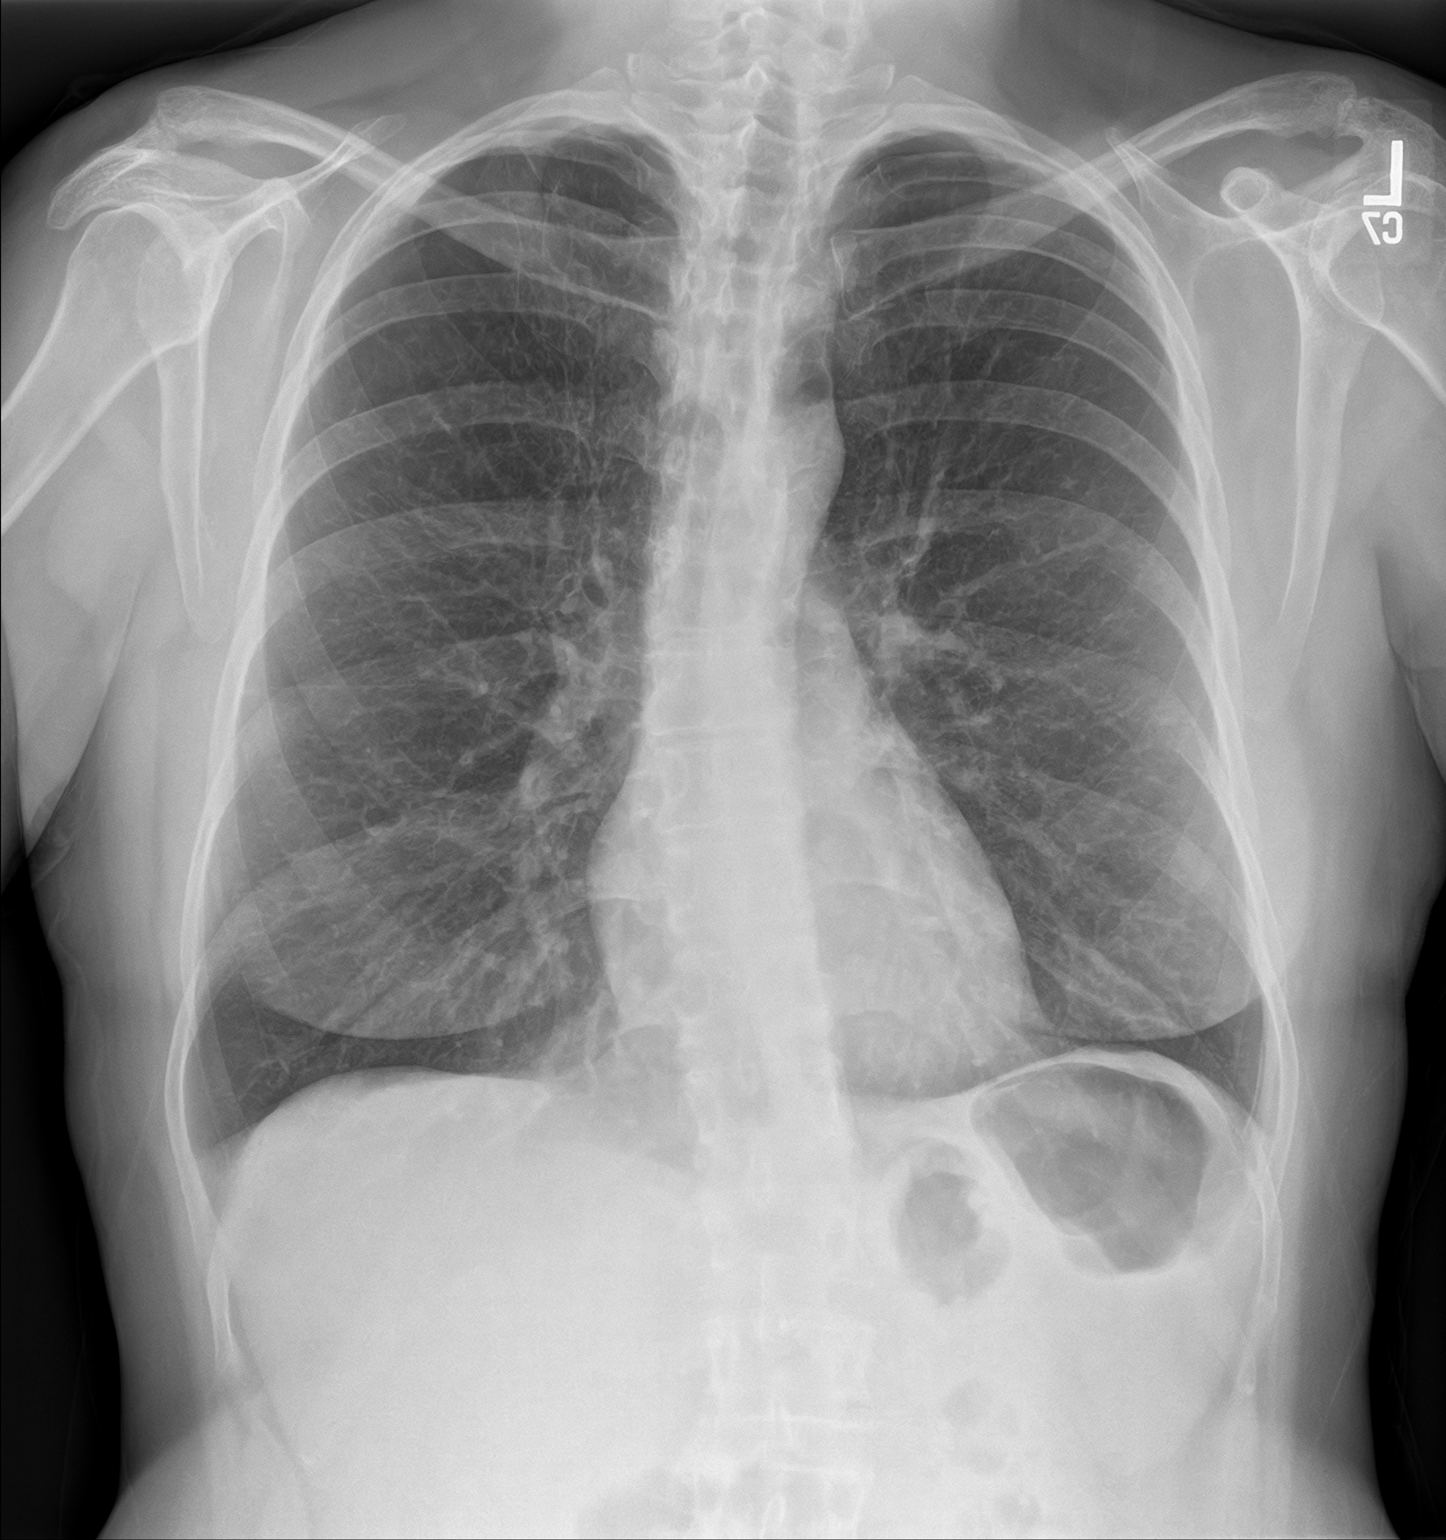
[im 2/2]
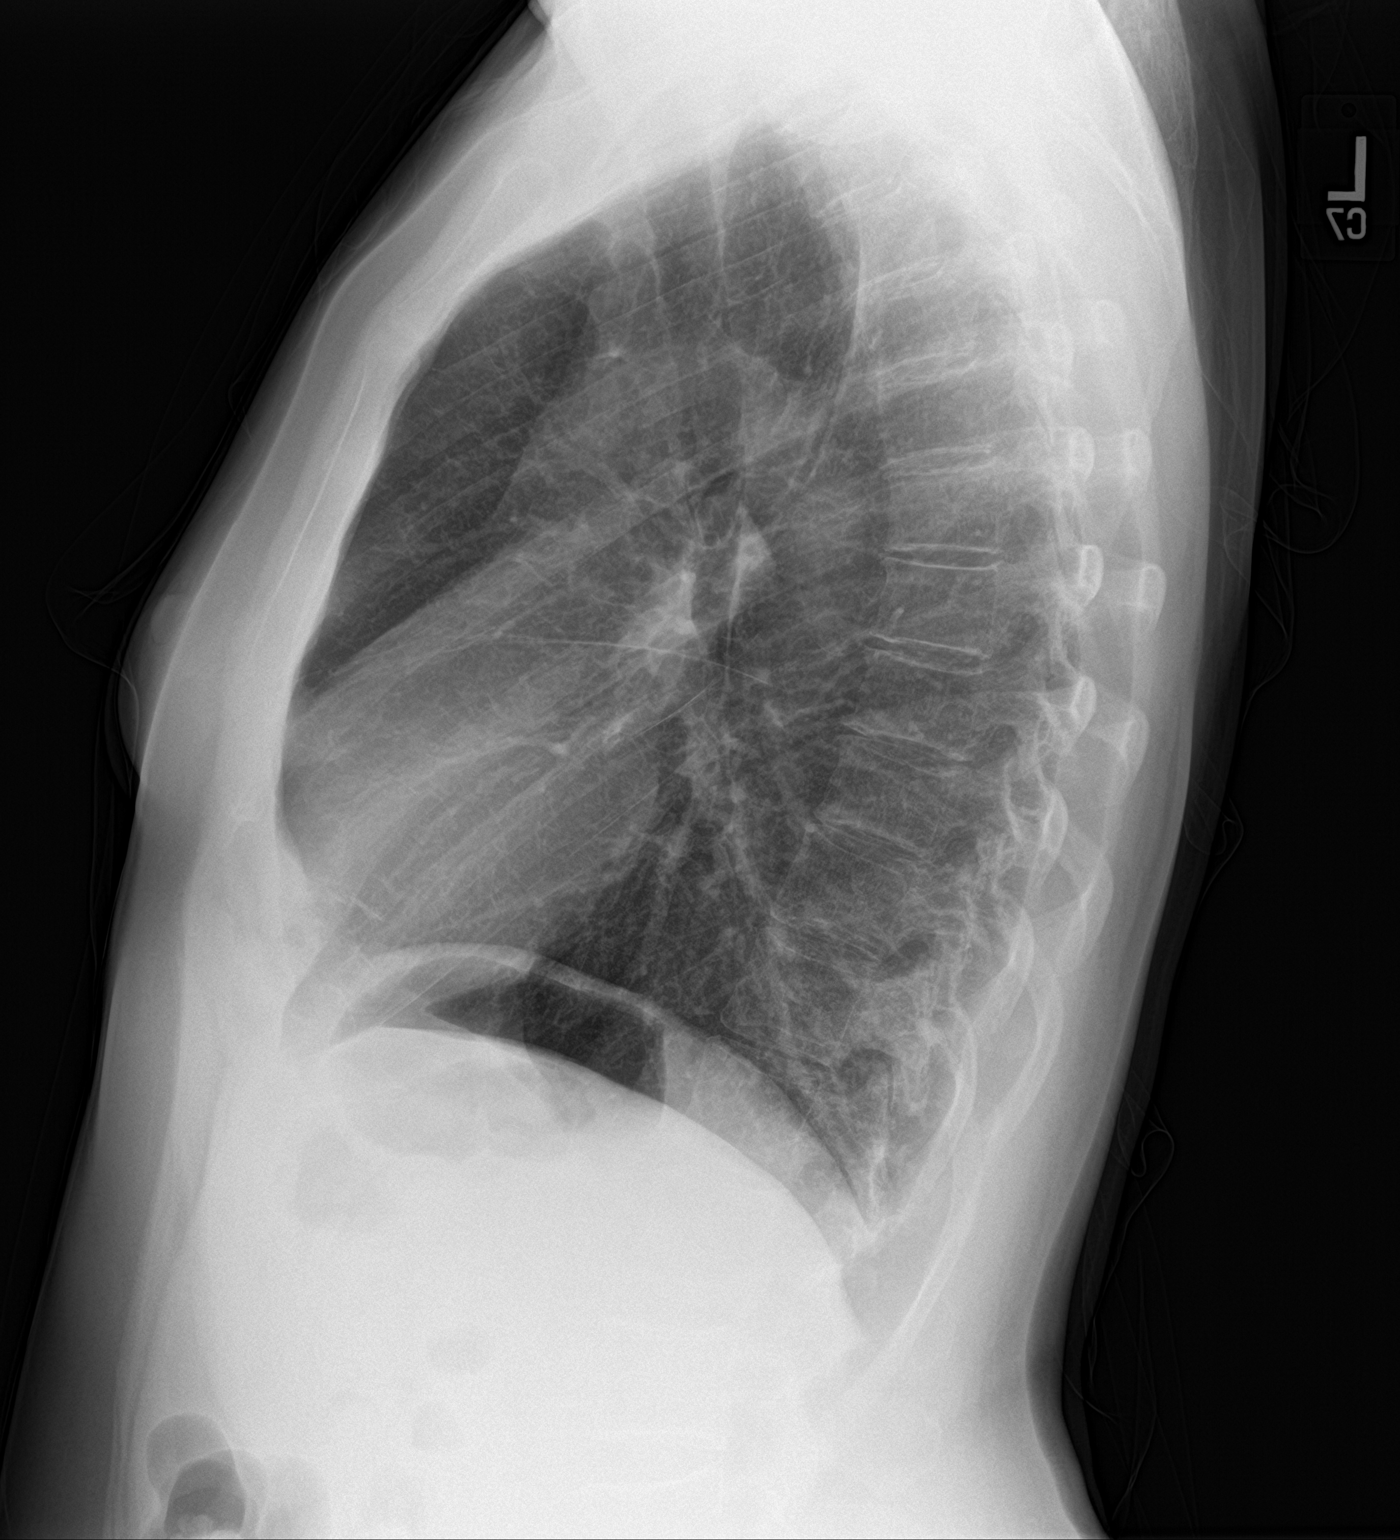

[2 of 2 positions shown; findings below may reference images not displayed]

FINDINGS: Apically emphysema right greater than left. Right apical blebs. No
pneumothorax.

Mild pulmonary hyperinflation. Lungs are clear without infiltrate
effusion or mass. Heart size and vascularity normal.
IMPRESSION: Apically emphysema right greater than left.  No acute abnormality.

## 2020-08-16 ENCOUNTER — Other Ambulatory Visit: Payer: Self-pay | Admitting: Family Medicine

## 2020-08-16 DIAGNOSIS — J3089 Other allergic rhinitis: Secondary | ICD-10-CM

## 2022-09-27 ENCOUNTER — Telehealth: Payer: Self-pay | Admitting: Primary Care

## 2022-09-27 NOTE — Telephone Encounter (Signed)
Patient last seen in 2021- left voicemail to see if patient would like to continue care with the PCP
